# Patient Record
Sex: Male | Born: 1960 | Race: Black or African American | Hispanic: No | Marital: Single | Smoking: Never smoker
Health system: Southern US, Community
[De-identification: ages and names within clinical notes are randomized; demographics above are authoritative.]

## PROBLEM LIST (undated history)

## (undated) DIAGNOSIS — E119 Type 2 diabetes mellitus without complications: Secondary | ICD-10-CM

## (undated) DIAGNOSIS — I10 Essential (primary) hypertension: Secondary | ICD-10-CM

## (undated) DIAGNOSIS — I639 Cerebral infarction, unspecified: Secondary | ICD-10-CM

## (undated) DIAGNOSIS — I219 Acute myocardial infarction, unspecified: Secondary | ICD-10-CM

## (undated) DIAGNOSIS — I2510 Atherosclerotic heart disease of native coronary artery without angina pectoris: Secondary | ICD-10-CM

## (undated) DIAGNOSIS — R569 Unspecified convulsions: Secondary | ICD-10-CM

## (undated) HISTORY — PX: CARDIAC CATHETERIZATION: SHX172

## (undated) HISTORY — PX: CORONARY ARTERY BYPASS GRAFT: SHX141

## (undated) HISTORY — PX: CHOLECYSTECTOMY: SHX55

## (undated) HISTORY — PX: MOUTH SURGERY: SHX715

---

## 1999-12-10 ENCOUNTER — Encounter: Admission: RE | Admit: 1999-12-10 | Discharge: 2000-03-09 | Payer: Self-pay | Admitting: Unknown Physician Specialty

## 2005-06-12 ENCOUNTER — Emergency Department (HOSPITAL_COMMUNITY): Admission: EM | Admit: 2005-06-12 | Discharge: 2005-06-13 | Payer: Self-pay | Admitting: Emergency Medicine

## 2006-07-10 ENCOUNTER — Inpatient Hospital Stay (HOSPITAL_COMMUNITY): Admission: EM | Admit: 2006-07-10 | Discharge: 2006-07-22 | Payer: Self-pay | Admitting: Emergency Medicine

## 2006-09-07 ENCOUNTER — Encounter (INDEPENDENT_AMBULATORY_CARE_PROVIDER_SITE_OTHER): Payer: Self-pay | Admitting: Surgery

## 2006-09-07 ENCOUNTER — Inpatient Hospital Stay (HOSPITAL_COMMUNITY): Admission: EM | Admit: 2006-09-07 | Discharge: 2006-09-19 | Payer: Self-pay | Admitting: Emergency Medicine

## 2010-08-14 NOTE — H&P (Signed)
NAME:  GARVIN, ELLENA NO.:  [id number]   MEDICAL RECORD NO.:  [id number]          PATIENT TYPE:  INP   LOCATION:  0103                         FACILITY:  [facility]   PHYSICIAN:  Sandria Bales. Ezzard Standing, M.D.  DATE OF BIRTH:  1960/12/03   DATE OF ADMISSION:  09/07/2006  DATE OF DISCHARGE:                              HISTORY & PHYSICAL   HISTORY OF PRESENT ILLNESS:  This is a 50 year old black male who is a  patient of [facility], though he had not seen them for a couple of  years, it does not sound like, who comes with a  1 week history of  epigastric abdominal pain. He also says that he has diarrhea, though his  diarrhea pre-dates this acute illness. He has no history of peptic ulcer  disease. No liver disease, liver disease, pancreatic disease, or colon  disease. Again, he thinks for 2 or 3 years has had an average of 6 bowel  movements a day. They are usually loose but he has not seen a GI doctor  for any evaluation of this. He has had no prior abdominal surgery. He  has not been followed by a physician on a regular basis.   ALLERGIES:  NO KNOWN DRUG ALLERGIES.   CURRENT MEDICATIONS:  This is part taken from him and part taken from  the old chart.  1. Clonidine 0.1 mg t.i.d.  2. Dilantin 100 mg t.i.d.  3. Norvasc 10 mg daily.  4. Lisinopril 40 mg daily.  5. Lantus insulin 18 units nightly.  6. NovoLog.   REVIEW OF SYSTEMS:  NEUROLOGIC:  He was discharged from the service in  1980 because of seizures. I am not sure of the etiology of this. He is  trying to get into the [facility] for his medical evaluation and is  awaiting this. He has seen Dr. Melbourne Abts in the past for a seizures but  he thinks it has been a couple of years. He also attempted suicide in  April of 2008. He apparently took an overdose of Metformin. He thinks he  is straight now from all of that.  PULMONARY:  No evidence of pneumonia or tuberculosis.  CARDIAC:  He had hypertension but no cardiac  catheterization, chest  pain, or cardiac evaluation.  GASTROINTESTINAL:  See history of present illness.  UROLOGIC:  No stones or kidney infections.  ENDOCRINE:  Again, he has been diabetic. He says that this is diet  controlled until just 2 or 3 months ago, where now she is on the  insulin.   SOCIAL HISTORY:  His girlfriend, Angelique Blonder, is in the room with him. He is  unemployed right now. 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   PHYSICAL EXAMINATION:  VITAL SIGNS:  Temperature 99. Pulse 84,  respiratory rate 16, blood pressure 189/109.  GENERAL:  A well nourished black male. Alert and cooperative.  HEENT:  Unremarkable.  NECK:  Supple. No masses or thyromegaly.  LUNGS:  Clear to auscultation with symmetric breath sounds.  HEART:  Regular rate  and rhythm. I hear no murmur or rub.  ABDOMEN:  He has a fullness and tenderness in his right upper quadrant,  consistent with acute cholecystitis. I feel no other mass, no  organomegaly. He has normal bowel sounds.  EXTREMITIES:  He has good strength in all 4 extremities.  NEUROLOGIC:  Grossly intact.   LABORATORY DATA:  White blood cell count 23,800. Hemoglobin and  hematocrit 12 and 35. Platelet count 374,000. His urine drug screen was  negative. His PT was 13.6 with an INR of 1.0. Sodium 132, potassium 3.5,  chloride 95, CO2 24, glucose 156, BUN 8. His lipase was 18. His  urinalysis was negative. Ammonia level 40.   I reviewed the CT scan, which did show acute inflammatory changes around  his gallbladder, consistent with acute cholecystitis.   DIAGNOSES:  1. Acute cholecystitis. I discussed with him and his girlfriend about      proceeding with cholecystectomy at this time. I told him that there      is probably about a 70% to 80% chance that we can do this      laparoscopically but there may be as much as a 30% chance that I      will have to do open surgery, again depending how acute  the      gallbladder is and whether I can manage this laparoscopically.  I talked about the potential complications of gallbladder surgery, which  includes bleeding, infection, bile duct injury, and the possibility of  open surgery.  1. Seizures, etiology unclear. The most recent seizure was about 2      months ago.  2. Chronic diarrhea. I talked about him seeing a medical doctor, to      manage or evaluate his diarrhea further after discharge.  3. Insulin-dependent diabetes mellitus.  4. History of hypertension.  5. History of depression/recent suicide attempt.      Sandria Bales. Ezzard Standing, M.D.  Electronically Signed     DHN/MEDQ  D:  09/07/2006  T:  09/07/2006  Job:  [id number]   cc:   Dr. Dan Humphreys - [facility]   Evie Lacks, MD  Fax: [phone]

## 2010-08-14 NOTE — Op Note (Signed)
NAME:  CASTON, COOPERSMITH NO.:  [id number]   MEDICAL RECORD NO.:  [id number]          PATIENT TYPE:  INP   LOCATION:  1235                         FACILITY:  [facility]   PHYSICIAN:  Sandria Bales. Ezzard Standing, M.D.  DATE OF BIRTH:  05-25-1960   DATE OF PROCEDURE:  09/07/2006  DATE OF DISCHARGE:                               OPERATIVE REPORT   PREOPERATIVE DIAGNOSIS:  Acute cholecystitis.   POSTOPERATIVE DIAGNOSIS:  Acute chronic cholecystitis with  cholelithiasis.   OPERATION PERFORMED:  Laparoscopic cholecystectomy with intraoperative  cholangiogram (one hour extra dissection time).   SURGEON:  Sandria Bales. Ezzard Standing, M.D.   ASSISTANT:  Thornton Park. Daphine Deutscher, MD   ANESTHESIA:  General endotracheal.   ESTIMATED BLOOD LOSS:  Probably 200 mL.   INDICATIONS FOR PROCEDURE:  Mr. Lowrey is a 50 year old black gentleman  who was recently hospitalized here with a diagnosis of adult onset  diabetes mellitus and a suicide attempt overdosing with metformin, has  developed abdominal pain over the last week, presented to the [facility] emergency department where he was found to have a leukocytosis of  23,000, a  CT scan consistent with acute cholecystitis.   The indications and potential complications of gallbladder surgery were  explained to the patient, potential complications include but not  limited to bleeding, infection, bile duct injury, the possibility of  open surgery.   The patient now comes for attempted laparoscopic cholecystectomy.   DESCRIPTION OF PROCEDURE:  The patient placed in supine position, give a  general endotracheal anesthetic.  He was given a gram of cefoxitin at  initiation of the procedure.  His abdomen was prepped with Betadine  solution and sterilely draped.   The gallbladder was palpable preoperatively.  I placed five trocars, 12  mm Hasson Ethicon trocar under his umbilicus, an 11 mm subxiphoid and  then three 5's, one left paramedian 5, one right subcostal  5 and one 5  mm lateral subcostal.  Primary attention turned to the gallbladder.  The  right and left lobes of the liver were unremarkable.  Stomach was  unremarkable.  The bowel that I could see was unremarkable.  But the  gallbladder was encased in omentum, which was taken down.  The  gallbladder was acutely edematous, acutely inflamed with evidence  chronic inflammation.   After stripping down the omentum off the gallbladder,  I spent time  dissecting down to the cystic duct gallbladder junction.  I thought I  got close to it.  I tried to shoot intraoperative cholangiograms but I  could never expose enough of the cystic duct to get an adequate  catheter.  I tried to thread the catheter down the gallbladder but never  could seal it enough without leaking backwards.  I thought I was well  away from the common bile duct.  I thought it the better part of valor  at this point since his anatomy was so poorly described since the  inflammation was so intense and chronic and since he went in with normal  liver functions, was to go ahead and just oversew his gallbladder stump.  So I divided the gallbladder out. I sutured one suture with a Vicryl 2-0  suture.  I then placed a 2-0 Vicryl, a 0-0 Vicryl Endoloop, and a 0-0  PDS Endoloop over this gallbladder stump.   I washed out the bed of the gall bladder and the omental inflammation  thoroughly.  I saw no bile leak. I thought I had the gallbladder remnant  completely sewed off.  I then removed the gallbladder from the  gallbladder bed.  It was a fairly bloody endeavor because of such  chronic inflammation with acute inflammation on top of it.  I irrigated  him out with probably 8 liters of saline.  The estimated blood loss was  probably 150 to 200 mL which would be a lot for a laparoscopic  procedure.  I then put the gallbladder in an EndoCatch bag and delivered  it through the umbilicus.  I then irrigated the abdomen.  I checked the  liver  bed.  I Bovied and cauterized where I needed to.  I placed a piece  of Surgicel in the liver bed, a piece of Surgicel in the omentum.  Again, I used between 8 and 9 liters of saline for irrigation.  I then  brought out a drain in the right lateral trocar site, a 19 [place] Blake  drain, and sewed in place with a 2-0 nylon suture.   At this point the patient had done well, blood pressure had been stable.  I thought the cystic duct gallbladder junction was secure with the  Endoloops.  I had identified the triangle of Calot where I imagined it  to be and the gallbladder bed there was no bleeding, no bile leak, no  other problem.  After taking down the anterior wall defect and hernia,  after removing the trocars I then sutured the umbilical port with 0-0  Vicryl suture.  I injected with about 20 mL of Marcaine and then sewed 5-  0 Vicryl suture at each port site and then steri-stripped the ports.  Sponge and needle count were correct at the end of the case.   The patient tolerated the procedure well, was transported to recovery  room in good condition.      Sandria Bales. Ezzard Standing, M.D.  Electronically Signed     DHN/MEDQ  D:  09/07/2006  T:  09/08/2006  Job:  [id number]   cc:   Evie Lacks, MD  Fax: [phone]

## 2010-08-17 NOTE — Discharge Summary (Signed)
NAME:  ZYRION, COEY NO.:  [id number]   MEDICAL RECORD NO.:  [id number]          PATIENT TYPE:  INP   LOCATION:  1538                         FACILITY:  [facility]   PHYSICIAN:  Sandria Bales. Ezzard Standing, M.D.  DATE OF BIRTH:  [birth year]   DATE OF ADMISSION:  09/07/2006  DATE OF DISCHARGE:  09/19/2006                               DISCHARGE SUMMARY   DISCHARGE DIAGNOSES:  1. Severe acute and chronic cholecystitis with cholelithiasis.  2. Abscess in right upper quadrant/gallbladder bed.  3. Thrombocytosis.  4. History of seizure disorder.  5. Insulin-dependent diabetes mellitus.  6. Hypertension.  7. History of depression/suicide attempt.   OPERATION PERFORMED:  The patient had a laparoscopic cholecystectomy  with intraoperative cholangiogram on the 8th of June 2008.  He then had  a percutaneous drainage of a right upper quadrant gallbladder fossa  abscess on the 13th of June 2008.   HISTORY OF ILLNESS:  This is a 50 year old black male who is a patient  of [facility] who is not seen on a regular basis.  He has had a history  of epigastric abdominal pain for about 1 week's time.  His girlfriend,  who came to visit him, finally convinced to go to the [facility] emergency  room.   He has had history of loose stools going for several years.  He has  never seen a GI doctor for this.  He has had no prior abdominal surgery.   PAST MEDICAL HISTORY:  1. History of seizures.  He is trying to get into the [facility] for      medical evaluation.  He has seen Dr. Melbourne Abts in the past.  The      etiology of seizures is unknown.  2. He had a history of a suicide attempt in April 2008, where he      overdosed on metformin.  3. He has a history of diabetes.  4. He has a history of hypertension.   On admission, his temperature was 99, pulse of 84.  His abdomen had  fullness and tenderness in the right upper quadrant.  His white blood  count was 23,800, hemoglobin 12, platelet count 374,000.   A CT scan that  he had revealed acute inflammatory change around the gallbladder  consistent with acute cholecystitis.   On the day of admission, he was taken to the operating room where he  underwent a laparoscopic cholecystectomy for a very severely inflamed  and acute cholecystitis.   He was placed on cefoxitin as antibiotic postoperatively.  Liver  functions bumped up from what was felt to be presumably secondary to his  acute cholecystitis.  He also developed a thrombocytosis felt to be  secondary to his acute inflammatory action.  By the 5th day, his  temperature was only 98.4, but his white blood count had leveled off at  25,300.  I obtained a CT scan on him that showed a right upper quadrant  abscess at the gallbladder site and scheduled a percutaneous drainage of  this abscess.   I had also left the drain in at the time  of surgery.  This was left in  the entire time.  He slowly got better with an improving white blood  count.  He was afebrile.  His platelets which peaked at about 1.6  million started decreasing, and he was ready for discharge.   He was discharged home on Cipro as an antibiotic.  He was Vicodin for  pain and taking aspirin for his platelet count.   He was to see me back in about 5 days for a wound check, drain check.  He was to call for any interval problem.  He could shower but should  otherwise take it easy.   His final pathology showed acute cholecystitis and mucosal ulceration  and necrosis.  No evidence of malignancy.   Also, I strongly encouraged him to get established with a primary  medical doctor on a regular basis, either here or at the [place] to help him  manage his seizures, his hypertension, his diabetes, and he acknowledged  understanding of this.      Sandria Bales. Ezzard Standing, M.D.  Electronically Signed     DHN/MEDQ  D:  10/02/2006  T:  10/02/2006  Job:  [id number]   cc:   [facility]

## 2010-08-17 NOTE — H&P (Signed)
NAME:  Donald Marks, Donald Marks NO.:  [id number]   MEDICAL RECORD NO.:  [id number]          PATIENT TYPE:  INP   LOCATION:  0109                         FACILITY:  [facility]   PHYSICIAN:  Lonia Blood, M.D.      DATE OF BIRTH:  1961-03-27   DATE OF ADMISSION:  07/10/2006  DATE OF DISCHARGE:                              HISTORY & PHYSICAL   PRIMARY CARE PHYSICIAN:  The patient is unassigned to [place].   PRESENTING COMPLAINT:  Suicide attempt.   HISTORY OF PRESENT ILLNESS:  The patient is a 50 year old gentleman that  was brought in here by the police secondary to suicide attempt.  The  patient apparently was arrested last month and has so much going on in  his life.  He sent in a 10-page suicide note to the police department  today and then took about 40 pills of metformin, according to him,  around 7 a.m. this morning.  He was brought in altered mental status,  weak.  He is currently fully awake and communicating.  Per patient, his  only complaint is that he has some queasy feelings in his stomach .  Otherwise, no other complaints.  He had no prior history of psychiatric  disease.   PAST MEDICAL HISTORY:  Significant for diabetes, seizure disorder, and  depression.   ALLERGIES:  He has no known drug allergies.   MEDICATIONS:  1. Xanax 1 mg p.r.n.  2. Ambien 10 mg q.h.s. p.r.n.  3. Percocet 5/325 as needed.  4. Monopril 40 mg daily.  5. Prednisone taper.  6. Vicoprofen 200/7.5 as needed.  7. Valtrex 1 g t.i.d.  8. Metformin 500 mg tablets.   SOCIAL HISTORY:  The patient lives alone.  Denied any tobacco or alcohol  use.  No IV drug use.   FAMILY HISTORY:  Significant for diabetes, hypertension in his father.  Mother died from stroke.   REVIEW OF SYSTEMS:  Twelve-point review of systems is negative except  per HPI.   PHYSICAL EXAMINATION:  VITAL SIGNS:  Temperature 97.6, blood pressure  initially 241/139, pulse 104, respiratory rate 22, saturations 96% on  room  air.  GENERAL:  The patient is awake, alert, oriented, in no acute distress.  He is restrained.  HEENT:  PERRL.  EOMI.  NECK:  Supple.  No JVD, no lymphadenopathy.  RESPIRATORY:  He has good air entry bilaterally.  No wheezes, no rales.  CARDIOVASCULAR:  He has S1, S2, no murmurs.  ABDOMEN:  Soft, nontender, with positive bowel sounds.  EXTREMITIES:  No edema, cyanosis or clubbing.   LABORATORIES:  A pH is 7.477, pCO2 26.7, pO2 134 on 2 L.  UA:  Essentially green urine, glucose 500, trace ketones, total protein of  100.  Urine drug screen is negative.  Initial cardiac enzymes negative.  Sodium 140, potassium 3.4, chloride 103, CO2 24, glucose 272, BUN 8,  creatinine 0.71, calcium 9.6, total protein 8.0, albumin 4.4, AST 36,  ALT 50.  BNP is currently pending.  Total bilirubin direct 0.1.  Acetaminophen level less than 10.  Lactic acid 5.0.   ASSESSMENT:  This is a 50 year old gentleman presenting with metformin  overdose with suicidal attempt.  The patient was trying to commit  suicide he has owned up to that.  He is currently awake, alert,  oriented, but will admit him for medical treatment prior to transfer  into inpatient psychiatry.   PLAN:  1. Will get him with serial glucose monitor.  Lactic acidosis has      already set in and will follow closely and get him on fluids.  No      insulin.  If his sugars start dropping will start some D5/water.  2. Hypokalemia.  Again, will replete his potassium.  We will follow      the patient's pH level by checking a repeat ABG.  If his pH drops      to less than 7.1, will consider either dialysis or sodium      bicarbonate.  At this point, however, use of sodium bicarbonate is      controversial, especially in the patient who is healthy and not      acidotic, although he has lactic acidosis.  3. Lactic acidosis.  As indicated above, the use of either bicarbonate      or dialysis at this point is not indicated based on his current pH,       but we will check an ABG every 4 hours x4 to see the trend.  4. Hypertension.  I will start the patient on some clonidine patch and      also oral clonidine if necessary and see how he does.  Otherwise,      will get psychiatry to see the patient once he is more stable,  and      then follow with some psychiatric care.      Lonia Blood, M.D.  Electronically Signed     LG/MEDQ  D:  07/10/2006  T:  07/10/2006  Job:  [id number]

## 2010-08-17 NOTE — Consult Note (Signed)
NAME:  Donald, Marks NO.:  [id number]   MEDICAL RECORD NO.:  [id number]          PATIENT TYPE:  IPS   LOCATION:  NA                            FACILITY[facility]  [facility]   PHYSICIAN:  Antonietta Breach, M.D.  DATE OF BIRTH:  04-19-1960   DATE OF CONSULTATION:  07/21/2006  DATE OF DISCHARGE:                                 CONSULTATION   Donald Marks continues without any suicidal ideation.  His concentration  is now within normal limits.  His energy has been normal.  His interests  have returned.  He has positive future goals and a constructive level of  regret about the suicide attempt.   He is sleeping well, and his appetite is normal.  He has received a  great deal of support from church members and family members, as well as  his friends.  He has a solid social support system.   He also has been talking to his church pastor about being involved in  some of the outreach of his church.  This will help him to have a sense  of vocational purpose, as he pursues his necessary legal battles and  potentially re-training in a different occupation.   MENTAL STATUS EXAM:  Donald Marks is alert.  He is oriented to all  spheres.  His thought process is logical, coherent, goal-directed.  No  looseness of associations.  THOUGHT CONTENT:  No thoughts of harming  himself.  No thoughts of harming others.  No delusions, no  hallucinations.  MEMORY:  within normal limits.  CONCENTRATION:  Within  normal limits.  AFFECT:  Broad and appropriate.  MOOD:  Within normal  limits.  INSIGHT:  Good.  JUDGMENT:  Intact.   ASSESSMENT:  Adjustment disorder with depressed mood, stable.   The patient desires outpatient psychiatric care and is no longer  committable.  The undersigned has provided ego-supportive psychotherapy and education  during his hospital course.  The patient acknowledges that outpatient  counseling for supporting coping skills and stress management will be  useful.   RECOMMENDATIONS:  The undersigned has asked the social worker to pursue  psychiatric followup at one of the following:  [facility].  One of the clinics attached to [facility], [place]  or [employer], or the county mental health center.  Other options  for the patient include family and child services.   Donald Marks agrees to call emergency services immediately for any  thoughts of harming himself, thoughts of harming other or distress.      Antonietta Breach, M.D.  Electronically Signed     JW/MEDQ  D:  07/21/2006  T:  07/21/2006  Job:  [id number]

## 2010-08-17 NOTE — Discharge Summary (Signed)
NAMEDANTA, BAUMGARDNER NO.:  [id number]   MEDICAL RECORD NO.:  [id number]          PATIENT TYPE:  INP   LOCATION:  1338                         [facility]AC[facility]   PHYSICIAN:  Ladell Pier, M.D.   DATE OF BIRTH:  [birth year]   DATE OF ADMISSION:  07/10/2006  DATE OF DISCHARGE:  07/22/2006                               DISCHARGE SUMMARY   ADDENDUM   DISCHARGE DIAGNOSIS:  As listed previously.   DISCHARGE MEDICATIONS:  1. Clonidine 0.1 t.i.d.  2. Dilantin 100 mg t.i.d.  3. Norvasc 10 mg daily.  4. Lisinopril 40 mg daily.  5. Lantus 18 units subcutaneously nightly.  6. Sliding scale insulin.  7. Hydrochlorothiazide 25 mg daily.   HOSPITAL COURSE:  1. Patient's discharge was held secondary to no bed available at the      [facility].  After waiting for a week for a bed, patient was re-      evaluated by Dr. Jeanie Sewer and found to be ready for discharge.      Patient will be scheduled with appointment for followup at the [facility] by the social worker.  2. Hypertension:  Blood pressure was elevated during this stay, so his      Norvasc and hydrochlorothiazide was added to his medications.  His      blood pressure on discharge was still slightly elevated.  He will      follow up with his primary care doctor for adjustment of his      medication.  On discharge, his blood pressure was 149/99.  3. Diabetes:  Blood sugars were elevated and his Lantus was increased      to 18 units.  Blood sugars normalized.  Blood sugar on discharge      was 82.   LABORATORY ON DISCHARGE:  Sodium 137, potassium 3.5, chloride 105, CO2  26, BUN 10, creatinine 0.62, glucose 130.      Ladell Pier, M.D.  Electronically Signed     NJ/MEDQ  D:  07/22/2006  T:  07/22/2006  Job:  [id number]

## 2010-08-17 NOTE — Consult Note (Signed)
NAMESOLAN, VOSLER NO.:  [id number]   MEDICAL RECORD NO.:  [id number]          PATIENT TYPE:  INP   LOCATION:  1226                         FACILITY:  [facility]   PHYSICIAN:  Antonietta Breach, M.D.  DATE OF BIRTH:  1960-04-06   DATE OF CONSULTATION:  07/11/2006  DATE OF DISCHARGE:                                 CONSULTATION   REQUESTING PHYSICIAN:  Dr. Mikeal Hawthorne   REASON FOR CONSULTATION:  Depression with suicide attempt.   HISTORY OF PRESENT ILLNESS:  [name] is a 50 year old male  admitted to the [facility] after ingesting 40 tablets of  Metformin.   [name] is very explicit in stating that this was a suicide attempt.  He has been experiencing depressed mood for several days along with  decreased concentration, insomnia, anhedonia, hopelessness, helplessness  as well as suicidal thoughts.   The patient has been charged with impersonating a Emergency planning/management officer.  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  He thoroughly believes that he is  innocent; however, given the charge of impersonating a Emergency planning/management officer,  he has not been able to work.  His income has dropped to none.  He faces  an indefinite amount of unemployment along with a protracted legal  process.   He has no thoughts of harming others, no delusions, no hallucinations.  He persists in his intent to kill himself; however, he is willing and  wants to accept psychiatric care including inpatient care.   PAST PSYCHIATRIC HISTORY:  The patient has no history of major  depression.  He has no history of mania, hallucinations, or delusions.  He has no history of psychotropic medication or psychiatric care.   FAMILY PSYCHIATRIC HISTORY:  His father reportedly suffers from  posttraumatic stress disorder secondary to [place] War experience.   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  He required medical  retirement due to seizures.  He did attend the police academy but did  not pass the physical section due to his seizure disorder.  He has no  children.  He is single.  He does not use alcohol or illegal drugs.  He  lives alone.  He has many supportive friends.   GENERAL MEDICAL PROBLEMS:  1. Seizure disorder.  2. Status post ingestion of 40 Metformin tablets.  3. Rule out hypertension.   MEDICATIONS:  The MAR is reviewed.  The patient is back on his Dilantin  100 mg t.i.d.  He is on Ativan 1-2 mg q.8h. p.r.n.  He has no known drug  allergies.   LABORATORY DATA:  CBC is unremarkable except for a slightly increased  white count of 11.4 which may be secondary to stress demargination.  He  has an INR of 0.9 which is within normal limits.  His metabolic panel is  significant for hyperglycemia at 272 upon initial and 174 this morning  at 6 a.m.  His SGOT is normal at 36.  SGPT is normal at 50.  Calcium is  9.3.  His lactic acid level this morning had decreased from 5 down to  1.5 which is within the normal range.  His TSH is 1.4, and that is  within normal limits.  Tylenol is negative.  Dilantin was negative when  he came in.  Aspirin negative.  Urine drug screen negative.  Alcohol  negative.  He is pending a new Dilantin level after reloading.   Head CT without contrast on the 10th of April showed no findings.   REVIEW OF SYSTEMS:  Noncontributory.   PHYSICAL EXAMINATION:  VITAL SIGNS:  Temperature 98.4, pulse 76,  respiration 20, blood pressure 100/52.   MENTAL STATUS EXAM:  [name] is a middle-aged male, lying in a supine  position in his hospital bed, alert and oriented to all spheres.  His  affect is very constricted.  His mood is depressed.  His fund of  knowledge and intelligence are within normal limits.  His speech  involves normal rate and prosody; however, he is soft spoken.  His  facies involve a very forlorn  expression with profound thoughts of  hopeless and helplessness as well as suicidal ideation.  Has no thoughts  of harming others.  He has no delusions or hallucinations.  Thought  process is logical, coherent, and goal direct.  No looseness of  associations.  Insight is intact for the severity of his depression.  His judgment is intact for the need of psychiatric treatment.  His  concentration is decreased.  He is well groomed and cooperative with the  interview as well as bedside care.   ASSESSMENT:  AXIS I:  1. Major depressive disorder, single episode, severe.  AXIS II:  Deferred.  AXIS III:  See general medical problems.  AXIS IV:  Legal occupational.  AXIS V:  20.   [name] is still at risk to harm himself.   RECOMMENDATIONS:  1. I would continue the sitter.  2. Once medically clear, would admit [name] to a psychiatric unit      for further evaluation and treatment of depression.  3. Psychotropic medication treatment deferred.      Antonietta Breach, M.D.  Electronically Signed     JW/MEDQ  D:  07/11/2006  T:  07/11/2006  Job:  [id number]

## 2010-08-17 NOTE — Discharge Summary (Signed)
Donald Marks, Donald Marks NO.:  [id number]   MEDICAL RECORD NO.:  [id number]          PATIENT TYPE:  INP   LOCATION:  1226                         FACILITY:  [facility]   PHYSICIAN:  Isidor Holts, M.D.  DATE OF BIRTH:  [birth year]   DATE OF ADMISSION:  07/10/2006  DATE OF DISCHARGE:                               DISCHARGE SUMMARY   PRIMARY MEDICAL DOCTOR:  Unassigned.   DISCHARGE DIAGNOSES:  1. Metformin overdose.  2. Lactic acidosis, secondary to #1 above.  3. Depression.  4. Suicide attempt.  5. Type 2 diabetes mellitus.  6. Seizure disorder.  7. Hypertension.   DISCHARGE MEDICATIONS:  1. Protonix 40 mg p.o. daily.  2. Clonidine 0.1 mg p.o. t.i.d.  3. Norvasc 5 mg p.o. daily.  4. Dilantin 100 mg p.o. t.i.d.  5. Lisinopril 40 mg p.o. daily.  6. Sliding scale insulin coverage, insulin-sensitive scale.   CONSULTATIONS:  Dr. Antonietta Breach, psychiatrist.   PROCEDURES:  1. Chest CT scan dated July 10, 2006.  This showed no acute      cardiopulmonary findings.  2. Head CT scan dated July 10, 2006.  This was a negative noncontrast      head CT.   ADMISSION HISTORY:  As in H&P notes of July 10, 2006,dictated by Dr.  Lonia Blood. However, in brief this is a 50 year old male, with known  history of type 2 diabetes mellitus, seizure disorder, hypertension, who  was brought to the emergency department at [facility] by police, following a suicide attempt in which the patient  ingested about 40 tablets of Metformin.  He had apparently sent a 10-  page suicide note to the police department.  On arrival, he was found to  be somewhat confused but otherwise clinically stable.  Blood pressure  was remarkably elevated at 241/139 mmHg.  Lactic acid level was 5.0.  ABG showed the following findings:  The pH was 7.477, pCO2 26.7, pO2 134  on 2 L of oxygen, CO2 was 24.  The patient was admitted for further  evaluation, investigation, and  management.   CLINICAL COURSE:  #1 - METFORMIN OVERDOSE.  For details of presentation,  refer to admission history above.  Reportedly, the patient ingested  about 40 pills of Metformin.  However, at the time of initial evaluation  he was found to have a blood glucose level of 272.  Lactic acid levels  were, however, elevated and he was admitted to the step-down unit,  monitored telemetrically, commenced on IV fluids, serial CBGs were  monitored.  Fortunately, no hypoglycemic episodes were documented  throughout the course of the patient's hospitalization and by July 11, 2006, lactic acid level had dropped to 1.5, i.e. within normal limits.  The patient had mild hypokalemia which was appropriately supplemented.   #2 - LACTIC ACIDOSIS SECONDARY TO #1 ABOVE.  As mentioned above, this  resolved by July 11, 2006.   #3 - DEPRESSION/SUICIDE ATTEMPT.  The patient was seen by Dr. Antonietta Breach, psychiatrist.  For details of consultation, refer to  consultation note of July 11, 2006.  Conclusion  is that the patient is  still at considerable risk of self-harm and will benefit from inpatient  management at the [facility].  Arrangements are therefore  in place to transfer the patient to [facility], once  medically cleared.   #4 - HYPERTENSION.  At the time of presentation the patient was found to  have markedly elevated blood pressure.  This was addressed with a  combination of Clonidine, ACE inhibitor and calcium channel blocker, and  we are glad to note that as of a.m. of July 12, 2006, blood pressure  had normalized at 120/78.   #5 - TYPE 2 DIABETES MELLITUS.  As mentioned in #1 above, the patient's  CBGs remained within reasonable limits during the course of his  hospitalization.  As of July 12, 2006, these ranged between 158 and  192.  We are understandably reluctant to commence the patient on oral  hypoglycemic medications at this time.  However, for  glycemic control  the patient has been placed on sliding scale insulin coverage in  sensitive scale for now.  He may be transitioned to oral hypoglycemic  medication after his psychiatric status has stabilized and he is cleared  by [facility] for discharge.   #6 - SEIZURE DISORDER.  No seizures were recorded during the course of  this hospitalization.  The patient continues on Dilantin.   DISPOSITION:  The patient was considered sufficiently clinically  recovered and stable medically to be discharged to [facility] for continued psychiatric management on July 12, 2006.  He has  therefore been discharged accordingly and will be transferred to  [facility] as soon as appropriate arrangements are in  place.  Metformin may be recommenced in an initial dose of 500mg  p.o. daily, in  a day or two.      Isidor Holts, M.D.  Electronically Signed     CO/MEDQ  D:  07/12/2006  T:  07/12/2006  Job:  [id number]   cc:   Antonietta Breach, M.D.

## 2010-08-17 NOTE — Discharge Summary (Signed)
[name], [name] NO.:  [id number]   MEDICAL RECORD NO.:  [id number]          PATIENT TYPE:  INP   LOCATION:  1338                         FACILITY:  [facility]   PHYSICIAN:  Hettie Holstein, D.O.    DATE OF BIRTH:  08/22/1960   DATE OF ADMISSION:  07/10/2006  DATE OF DISCHARGE:                               DISCHARGE SUMMARY   ADDENDUM:  Please append this to job [id number].   Adjustments to medications prior to transfer to [facility] psychiatric  inpatient bed.  Please refer to medications as dictated by Dr.  Isidor Holts.  This list is going to be amended in that he will be  continued on:   1. Clonidine 0.1 mg p.o. t.i.d. as described.  2. Dilantin 100 mg p.o. t.i.d. as described.  3. Norvasc 10 mg p.o. q.24h.  This is an increase.  4. Lisinopril 40 mg p.o. q.24h.  5. Lantus 10 units subcu q.h.s. with instructions to follow his CBGs      as per insulin sliding scale at the facility.  He can be placed on      a moderate insulin sensitivity scale.  His Lantus can begin at 10      units subcu q.h.s., be adjusted based on his a.m. CBG fasting.      Hettie Holstein, D.O.  Electronically Signed     ESS/MEDQ  D:  07/17/2006  T:  07/17/2006  Job:  [id number] [id number]   cc:   [facility]

## 2011-01-16 LAB — DIFFERENTIAL
Basophils Absolute: 0.2 — ABNORMAL HIGH
Basophils Relative: 0
Eosinophils Absolute: 0.2
Eosinophils Relative: 1
Lymphocytes Relative: 14
Lymphocytes Relative: 15
Lymphs Abs: 2.4
Monocytes Relative: 7
Monocytes Relative: 8
Neutro Abs: 12.7 — ABNORMAL HIGH
Neutrophils Relative %: 77

## 2011-01-16 LAB — BASIC METABOLIC PANEL WITH GFR
BUN: 8
CO2: 24
Calcium: 8.7
Chloride: 98
Creatinine, Ser: 0.72
GFR calc non Af Amer: >60
Glucose, Bld: 115 — ABNORMAL HIGH
Potassium: 4.3
Sodium: 132 — ABNORMAL LOW

## 2011-01-16 LAB — CBC
HCT: 25.2 — ABNORMAL LOW
HCT: 25.4 — ABNORMAL LOW
HCT: 25.4 — ABNORMAL LOW
HCT: 25.7 — ABNORMAL LOW
HCT: 26.1 — ABNORMAL LOW
HCT: 26.3 — ABNORMAL LOW
Hemoglobin: 8.7 — ABNORMAL LOW
Hemoglobin: 8.7 — ABNORMAL LOW
Hemoglobin: 8.9 — ABNORMAL LOW
Hemoglobin: 8.9 — ABNORMAL LOW
MCHC: 33.9
MCHC: 34.1
MCHC: 34.4
MCHC: 34.4
MCHC: 34.6
MCV: 84.2
MCV: 84.3
MCV: 84.3
MCV: 84.5
Platelets: 1201
Platelets: 1494
Platelets: 1589
Platelets: 1616
Platelets: 1739
RBC: 3.01 — ABNORMAL LOW
RBC: 3.01 — ABNORMAL LOW
RBC: 3.05 — ABNORMAL LOW
RBC: 3.12 — ABNORMAL LOW
RDW: 13.2
RDW: 13.3
RDW: 13.4
RDW: 13.5
RDW: 13.6
WBC: 16.8 — ABNORMAL HIGH
WBC: 19.5 — ABNORMAL HIGH
WBC: 19.7 — ABNORMAL HIGH
WBC: 21.1 — ABNORMAL HIGH
WBC: 22.6 — ABNORMAL HIGH

## 2011-01-16 LAB — COMPREHENSIVE METABOLIC PANEL
Albumin: 2 — ABNORMAL LOW
Albumin: 2.3 — ABNORMAL LOW
Alkaline Phosphatase: 450 — ABNORMAL HIGH
Alkaline Phosphatase: 578 — ABNORMAL HIGH
Alkaline Phosphatase: 605 — ABNORMAL HIGH
BUN: 10
BUN: 12
BUN: 13
CO2: 26
Calcium: 8.9
Chloride: 99
Creatinine, Ser: 0.82
Creatinine, Ser: 0.92
GFR calc Af Amer: >60
GFR calc non Af Amer: >60
Glucose, Bld: 127 — ABNORMAL HIGH
Potassium: 4.3
Potassium: 5.2 — ABNORMAL HIGH
Sodium: 131 — ABNORMAL LOW
Total Bilirubin: 0.8
Total Protein: 7

## 2011-01-16 LAB — HEPATIC FUNCTION PANEL
AST: 44 — ABNORMAL HIGH
Total Bilirubin: 0.8
Total Protein: 7.1

## 2011-01-16 LAB — BASIC METABOLIC PANEL
BUN: 8
CO2: 26
GFR calc Af Amer: >60

## 2011-01-16 LAB — LIPASE, BLOOD: Lipase: 25

## 2011-01-17 LAB — RAPID URINE DRUG SCREEN, HOSP PERFORMED
Cocaine: NOT DETECTED
Tetrahydrocannabinol: NOT DETECTED

## 2011-01-17 LAB — DIFFERENTIAL
Basophils Absolute: 0
Basophils Absolute: 0
Basophils Absolute: 0
Basophils Absolute: 0
Basophils Absolute: 0
Basophils Relative: 0
Basophils Relative: 0
Basophils Relative: 0
Eosinophils Absolute: 0
Eosinophils Absolute: 0.1
Eosinophils Absolute: 0.1
Eosinophils Absolute: 0.3
Eosinophils Relative: 0
Eosinophils Relative: 0
Eosinophils Relative: 0
Eosinophils Relative: 0
Eosinophils Relative: 1
Lymphocytes Relative: 5 — ABNORMAL LOW
Lymphocytes Relative: 5 — ABNORMAL LOW
Lymphocytes Relative: 7 — ABNORMAL LOW
Lymphocytes Relative: 7 — ABNORMAL LOW
Lymphs Abs: 1.6
Lymphs Abs: 1.8
Lymphs Abs: 2.3
Monocytes Absolute: 2 — ABNORMAL HIGH
Monocytes Absolute: 2.3 — ABNORMAL HIGH
Monocytes Absolute: 2.5 — ABNORMAL HIGH
Monocytes Relative: 1 — ABNORMAL LOW
Monocytes Relative: 8
Monocytes Relative: 9
Neutro Abs: 15.3 — ABNORMAL HIGH
Neutro Abs: 18.2 — ABNORMAL HIGH
Neutro Abs: 21.6 — ABNORMAL HIGH
Neutro Abs: 34.2 — ABNORMAL HIGH
Neutrophils Relative %: 79 — ABNORMAL HIGH
Neutrophils Relative %: 80 — ABNORMAL HIGH
Neutrophils Relative %: 84 — ABNORMAL HIGH
Neutrophils Relative %: 84 — ABNORMAL HIGH
Neutrophils Relative %: 86 — ABNORMAL HIGH

## 2011-01-17 LAB — CBC
HCT: 25.7 — ABNORMAL LOW
HCT: 27.4 — ABNORMAL LOW
HCT: 31.9 — ABNORMAL LOW
Hemoglobin: 12.1 — ABNORMAL LOW
Hemoglobin: 9.4 — ABNORMAL LOW
Hemoglobin: 9.5 — ABNORMAL LOW
Hemoglobin: 9.6 — ABNORMAL LOW
MCHC: 34.1
MCHC: 34.6
MCHC: 34.7
MCHC: 34.9
MCV: 83.7
MCV: 84.3
MCV: 85.4
Platelets: 1136
Platelets: 334
Platelets: 374
Platelets: 946
RBC: 3.02 — ABNORMAL LOW
RBC: 3.21 — ABNORMAL LOW
RBC: 3.32 — ABNORMAL LOW
RBC: 3.73 — ABNORMAL LOW
RBC: 4.17 — ABNORMAL LOW
RDW: 13
RDW: 13.4
RDW: 13.4
RDW: 13.5
RDW: 13.6
WBC: 19.4 — ABNORMAL HIGH
WBC: 21.2 — ABNORMAL HIGH
WBC: 23.8 — ABNORMAL HIGH
WBC: 25.2 — ABNORMAL HIGH
WBC: 36.4 — ABNORMAL HIGH

## 2011-01-17 LAB — CULTURE, BLOOD (ROUTINE X 2)

## 2011-01-17 LAB — URINALYSIS, ROUTINE W REFLEX MICROSCOPIC
Glucose, UA: NEGATIVE
Ketones, ur: >80 — AB
Nitrite: NEGATIVE
Specific Gravity, Urine: 1.021
pH: 6.5

## 2011-01-17 LAB — COMPREHENSIVE METABOLIC PANEL
ALT: 37
ALT: 53
ALT: 60 — ABNORMAL HIGH
AST: 16
AST: 55 — ABNORMAL HIGH
Albumin: 2.8 — ABNORMAL LOW
Alkaline Phosphatase: 211 — ABNORMAL HIGH
Alkaline Phosphatase: 225 — ABNORMAL HIGH
BUN: 4 — ABNORMAL LOW
BUN: 7
CO2: 24
CO2: 25
CO2: 26
CO2: 27
Calcium: 8.5
Calcium: 8.9
Chloride: 98
Chloride: 99
Creatinine, Ser: 0.8
Creatinine, Ser: 0.81
GFR calc Af Amer: >60
GFR calc Af Amer: >60
GFR calc non Af Amer: >60
GFR calc non Af Amer: >60
GFR calc non Af Amer: >60
Glucose, Bld: 153 — ABNORMAL HIGH
Glucose, Bld: 184 — ABNORMAL HIGH
Glucose, Bld: 207 — ABNORMAL HIGH
Potassium: 3.5
Potassium: 3.8
Sodium: 132 — ABNORMAL LOW
Sodium: 134 — ABNORMAL LOW
Total Bilirubin: 1.6 — ABNORMAL HIGH
Total Bilirubin: 2.8 — ABNORMAL HIGH
Total Protein: 6.6

## 2011-01-17 LAB — BASIC METABOLIC PANEL
BUN: 6
CO2: 26
Calcium: 8.4
Creatinine, Ser: 0.81
GFR calc non Af Amer: >60
Glucose, Bld: 133 — ABNORMAL HIGH
Sodium: 130 — ABNORMAL LOW

## 2011-01-17 LAB — WOUND CULTURE

## 2011-01-17 LAB — URINE MICROSCOPIC-ADD ON

## 2011-01-17 LAB — PHENYTOIN LEVEL, TOTAL: Phenytoin Lvl: 8.8 — ABNORMAL LOW

## 2011-01-17 LAB — ANAEROBIC CULTURE

## 2011-01-17 LAB — AMMONIA: Ammonia: 40 — ABNORMAL HIGH

## 2011-01-17 LAB — PROTIME-INR: INR: 1

## 2015-05-03 DIAGNOSIS — I639 Cerebral infarction, unspecified: Secondary | ICD-10-CM

## 2015-05-03 HISTORY — DX: Cerebral infarction, unspecified: I63.9

## 2015-05-11 ENCOUNTER — Emergency Department (HOSPITAL_COMMUNITY): Payer: Non-veteran care

## 2015-05-11 ENCOUNTER — Inpatient Hospital Stay (HOSPITAL_COMMUNITY)
Admission: EM | Admit: 2015-05-11 | Discharge: 2015-05-16 | DRG: 065 | Disposition: A | Discharge status: Skilled Nursing Facility | Payer: Non-veteran care | Attending: Family Medicine | Admitting: Family Medicine

## 2015-05-11 ENCOUNTER — Encounter (HOSPITAL_COMMUNITY): Payer: Self-pay

## 2015-05-11 DIAGNOSIS — R569 Unspecified convulsions: Secondary | ICD-10-CM | POA: Diagnosis not present

## 2015-05-11 DIAGNOSIS — Z91041 Radiographic dye allergy status: Secondary | ICD-10-CM

## 2015-05-11 DIAGNOSIS — Z794 Long term (current) use of insulin: Secondary | ICD-10-CM

## 2015-05-11 DIAGNOSIS — Z951 Presence of aortocoronary bypass graft: Secondary | ICD-10-CM

## 2015-05-11 DIAGNOSIS — Z833 Family history of diabetes mellitus: Secondary | ICD-10-CM

## 2015-05-11 DIAGNOSIS — Z6834 Body mass index (BMI) 34.0-34.9, adult: Secondary | ICD-10-CM

## 2015-05-11 DIAGNOSIS — R29702 NIHSS score 2: Secondary | ICD-10-CM | POA: Diagnosis present

## 2015-05-11 DIAGNOSIS — Z955 Presence of coronary angioplasty implant and graft: Secondary | ICD-10-CM

## 2015-05-11 DIAGNOSIS — I639 Cerebral infarction, unspecified: Secondary | ICD-10-CM | POA: Diagnosis present

## 2015-05-11 DIAGNOSIS — M25461 Effusion, right knee: Secondary | ICD-10-CM | POA: Diagnosis not present

## 2015-05-11 DIAGNOSIS — I2510 Atherosclerotic heart disease of native coronary artery without angina pectoris: Secondary | ICD-10-CM | POA: Diagnosis present

## 2015-05-11 DIAGNOSIS — E1165 Type 2 diabetes mellitus with hyperglycemia: Secondary | ICD-10-CM | POA: Diagnosis present

## 2015-05-11 DIAGNOSIS — R739 Hyperglycemia, unspecified: Secondary | ICD-10-CM | POA: Insufficient documentation

## 2015-05-11 DIAGNOSIS — R402252 Coma scale, best verbal response, oriented, at arrival to emergency department: Secondary | ICD-10-CM | POA: Diagnosis present

## 2015-05-11 DIAGNOSIS — R52 Pain, unspecified: Secondary | ICD-10-CM

## 2015-05-11 DIAGNOSIS — N179 Acute kidney failure, unspecified: Secondary | ICD-10-CM | POA: Insufficient documentation

## 2015-05-11 DIAGNOSIS — E1151 Type 2 diabetes mellitus with diabetic peripheral angiopathy without gangrene: Secondary | ICD-10-CM | POA: Diagnosis present

## 2015-05-11 DIAGNOSIS — G8194 Hemiplegia, unspecified affecting left nondominant side: Secondary | ICD-10-CM | POA: Diagnosis present

## 2015-05-11 DIAGNOSIS — R7989 Other specified abnormal findings of blood chemistry: Secondary | ICD-10-CM | POA: Insufficient documentation

## 2015-05-11 DIAGNOSIS — I252 Old myocardial infarction: Secondary | ICD-10-CM

## 2015-05-11 DIAGNOSIS — Z9049 Acquired absence of other specified parts of digestive tract: Secondary | ICD-10-CM

## 2015-05-11 DIAGNOSIS — Z7982 Long term (current) use of aspirin: Secondary | ICD-10-CM

## 2015-05-11 DIAGNOSIS — Y93E1 Activity, personal bathing and showering: Secondary | ICD-10-CM

## 2015-05-11 DIAGNOSIS — I638 Other cerebral infarction: Secondary | ICD-10-CM | POA: Diagnosis not present

## 2015-05-11 DIAGNOSIS — Z79899 Other long term (current) drug therapy: Secondary | ICD-10-CM

## 2015-05-11 DIAGNOSIS — E118 Type 2 diabetes mellitus with unspecified complications: Secondary | ICD-10-CM

## 2015-05-11 DIAGNOSIS — Z8249 Family history of ischemic heart disease and other diseases of the circulatory system: Secondary | ICD-10-CM

## 2015-05-11 DIAGNOSIS — W182XXA Fall in (into) shower or empty bathtub, initial encounter: Secondary | ICD-10-CM | POA: Diagnosis not present

## 2015-05-11 DIAGNOSIS — E785 Hyperlipidemia, unspecified: Secondary | ICD-10-CM | POA: Diagnosis present

## 2015-05-11 DIAGNOSIS — R402362 Coma scale, best motor response, obeys commands, at arrival to emergency department: Secondary | ICD-10-CM | POA: Diagnosis present

## 2015-05-11 DIAGNOSIS — G40909 Epilepsy, unspecified, not intractable, without status epilepticus: Secondary | ICD-10-CM | POA: Diagnosis present

## 2015-05-11 DIAGNOSIS — R531 Weakness: Secondary | ICD-10-CM | POA: Diagnosis not present

## 2015-05-11 DIAGNOSIS — E669 Obesity, unspecified: Secondary | ICD-10-CM | POA: Diagnosis present

## 2015-05-11 DIAGNOSIS — I10 Essential (primary) hypertension: Secondary | ICD-10-CM | POA: Diagnosis present

## 2015-05-11 DIAGNOSIS — Z23 Encounter for immunization: Secondary | ICD-10-CM

## 2015-05-11 DIAGNOSIS — R296 Repeated falls: Secondary | ICD-10-CM | POA: Diagnosis present

## 2015-05-11 DIAGNOSIS — R402142 Coma scale, eyes open, spontaneous, at arrival to emergency department: Secondary | ICD-10-CM | POA: Diagnosis present

## 2015-05-11 DIAGNOSIS — IMO0002 Reserved for concepts with insufficient information to code with codable children: Secondary | ICD-10-CM | POA: Insufficient documentation

## 2015-05-11 DIAGNOSIS — I161 Hypertensive emergency: Secondary | ICD-10-CM | POA: Diagnosis present

## 2015-05-11 HISTORY — DX: Atherosclerotic heart disease of native coronary artery without angina pectoris: I25.10

## 2015-05-11 HISTORY — DX: Unspecified convulsions: R56.9

## 2015-05-11 HISTORY — DX: Essential (primary) hypertension: I10

## 2015-05-11 HISTORY — DX: Type 2 diabetes mellitus without complications: E11.9

## 2015-05-11 HISTORY — DX: Acute myocardial infarction, unspecified: I21.9

## 2015-05-11 HISTORY — DX: Cerebral infarction, unspecified: I63.9

## 2015-05-11 LAB — CBC
HCT: 36.6 % — ABNORMAL LOW (ref 39.0–52.0)
Hemoglobin: 12.1 g/dL — ABNORMAL LOW (ref 13.0–17.0)
MCH: 29.1 pg (ref 26.0–34.0)
MCHC: 33.1 g/dL (ref 30.0–36.0)
MCV: 88 fL (ref 78.0–100.0)
Platelets: 325 10*3/uL (ref 150–400)
RBC: 4.16 MIL/uL — AB (ref 4.22–5.81)
RDW: 13.5 % (ref 11.5–15.5)
WBC: 7.5 10*3/uL (ref 4.0–10.5)

## 2015-05-11 LAB — URINE MICROSCOPIC-ADD ON

## 2015-05-11 LAB — URINALYSIS, ROUTINE W REFLEX MICROSCOPIC
Bilirubin Urine: NEGATIVE
Glucose, UA: >1000 mg/dL — AB
Hgb urine dipstick: NEGATIVE
KETONES UR: NEGATIVE mg/dL
LEUKOCYTES UA: NEGATIVE
NITRITE: NEGATIVE
Protein, ur: 100 mg/dL — AB
Specific Gravity, Urine: 1.017 (ref 1.005–1.030)
pH: 6.5 (ref 5.0–8.0)

## 2015-05-11 LAB — COMPREHENSIVE METABOLIC PANEL
ALT: 13 U/L — AB (ref 17–63)
AST: 21 U/L (ref 15–41)
Albumin: 2.9 g/dL — ABNORMAL LOW (ref 3.5–5.0)
Alkaline Phosphatase: 90 U/L (ref 38–126)
Anion gap: 10 (ref 5–15)
BILIRUBIN TOTAL: 0.2 mg/dL — AB (ref 0.3–1.2)
BUN: 20 mg/dL (ref 6–20)
CALCIUM: 8.7 mg/dL — AB (ref 8.9–10.3)
CO2: 21 mmol/L — ABNORMAL LOW (ref 22–32)
CREATININE: 2.11 mg/dL — AB (ref 0.61–1.24)
Chloride: 109 mmol/L (ref 101–111)
GFR calc Af Amer: 39 mL/min — ABNORMAL LOW (ref >60)
GFR, EST NON AFRICAN AMERICAN: 34 mL/min — AB (ref >60)
Glucose, Bld: 304 mg/dL — ABNORMAL HIGH (ref 65–99)
POTASSIUM: 4.2 mmol/L (ref 3.5–5.1)
Sodium: 140 mmol/L (ref 135–145)
TOTAL PROTEIN: 6.1 g/dL — AB (ref 6.5–8.1)

## 2015-05-11 LAB — I-STAT CHEM 8, ED
BUN: 22 mg/dL — AB (ref 6–20)
CHLORIDE: 106 mmol/L (ref 101–111)
Calcium, Ion: 1.12 mmol/L (ref 1.12–1.23)
Creatinine, Ser: 1.9 mg/dL — ABNORMAL HIGH (ref 0.61–1.24)
Glucose, Bld: 302 mg/dL — ABNORMAL HIGH (ref 65–99)
HCT: 38 % — ABNORMAL LOW (ref 39.0–52.0)
Hemoglobin: 12.9 g/dL — ABNORMAL LOW (ref 13.0–17.0)
Potassium: 4 mmol/L (ref 3.5–5.1)
SODIUM: 141 mmol/L (ref 135–145)
TCO2: 22 mmol/L (ref 0–100)

## 2015-05-11 LAB — ETHANOL

## 2015-05-11 LAB — APTT: aPTT: 29 seconds (ref 24–37)

## 2015-05-11 LAB — I-STAT TROPONIN, ED: TROPONIN I, POC: 0 ng/mL (ref 0.00–0.08)

## 2015-05-11 LAB — DIFFERENTIAL
Basophils Absolute: 0.1 10*3/uL (ref 0.0–0.1)
Basophils Relative: 1 %
EOS ABS: 0.4 10*3/uL (ref 0.0–0.7)
Eosinophils Relative: 6 %
LYMPHS ABS: 2.4 10*3/uL (ref 0.7–4.0)
Lymphocytes Relative: 32 %
MONOS PCT: 7 %
Monocytes Absolute: 0.5 10*3/uL (ref 0.1–1.0)
Neutro Abs: 4 10*3/uL (ref 1.7–7.7)
Neutrophils Relative %: 54 %

## 2015-05-11 LAB — RAPID URINE DRUG SCREEN, HOSP PERFORMED
Amphetamines: NOT DETECTED
BARBITURATES: NOT DETECTED
Benzodiazepines: NOT DETECTED
Cocaine: NOT DETECTED
Opiates: NOT DETECTED
Tetrahydrocannabinol: NOT DETECTED

## 2015-05-11 LAB — PROTIME-INR
INR: 0.93 (ref 0.00–1.49)
PROTHROMBIN TIME: 12.7 s (ref 11.6–15.2)

## 2015-05-11 LAB — GLUCOSE, CAPILLARY
GLUCOSE-CAPILLARY: 156 mg/dL — AB (ref 65–99)
Glucose-Capillary: 123 mg/dL — ABNORMAL HIGH (ref 65–99)

## 2015-05-11 MED ORDER — ACETAMINOPHEN 325 MG PO TABS
650.0000 mg | ORAL_TABLET | ORAL | Status: DC | PRN
  Administered 2015-05-11 – 2015-05-16 (×2): 650 mg via ORAL
  Filled 2015-05-11 (×2): qty 2

## 2015-05-11 MED ORDER — SODIUM CHLORIDE 0.9 % IV SOLN
INTRAVENOUS | Status: DC
  Administered 2015-05-11: 17:00:00 via INTRAVENOUS

## 2015-05-11 MED ORDER — ASPIRIN 325 MG PO TABS
325.0000 mg | ORAL_TABLET | Freq: Every day | ORAL | Status: DC
  Administered 2015-05-11 – 2015-05-12 (×2): 325 mg via ORAL
  Filled 2015-05-11 (×2): qty 1

## 2015-05-11 MED ORDER — PNEUMOCOCCAL VAC POLYVALENT 25 MCG/0.5ML IJ INJ
0.5000 mL | INJECTION | INTRAMUSCULAR | Status: AC
  Administered 2015-05-12: 0.5 mL via INTRAMUSCULAR

## 2015-05-11 MED ORDER — INSULIN GLARGINE 100 UNIT/ML ~~LOC~~ SOLN
10.0000 [IU] | Freq: Every day | SUBCUTANEOUS | Status: DC
  Administered 2015-05-11 – 2015-05-15 (×5): 10 [IU] via SUBCUTANEOUS
  Filled 2015-05-11 (×8): qty 0.1

## 2015-05-11 MED ORDER — STROKE: EARLY STAGES OF RECOVERY BOOK
Freq: Once | Status: AC
  Administered 2015-05-11: 22:00:00

## 2015-05-11 MED ORDER — INSULIN ASPART 100 UNIT/ML ~~LOC~~ SOLN: 0.0000 [IU] | Freq: Every day | SUBCUTANEOUS | Status: DC

## 2015-05-11 MED ORDER — ACETAMINOPHEN 650 MG RE SUPP: 650.0000 mg | RECTAL | Status: DC | PRN

## 2015-05-11 MED ORDER — HYDRALAZINE HCL 20 MG/ML IJ SOLN
10.0000 mg | Freq: Four times a day (QID) | INTRAMUSCULAR | Status: DC | PRN
  Administered 2015-05-11: 10 mg via INTRAVENOUS
  Filled 2015-05-11: qty 1

## 2015-05-11 MED ORDER — ENOXAPARIN SODIUM 40 MG/0.4ML ~~LOC~~ SOLN
40.0000 mg | SUBCUTANEOUS | Status: DC
  Administered 2015-05-11 – 2015-05-16 (×6): 40 mg via SUBCUTANEOUS
  Filled 2015-05-11 (×6): qty 0.4

## 2015-05-11 MED ORDER — INSULIN ASPART 100 UNIT/ML ~~LOC~~ SOLN
0.0000 [IU] | Freq: Three times a day (TID) | SUBCUTANEOUS | Status: DC
  Administered 2015-05-11: 3 [IU] via SUBCUTANEOUS
  Administered 2015-05-12 (×3): 2 [IU] via SUBCUTANEOUS
  Administered 2015-05-13: 3 [IU] via SUBCUTANEOUS
  Administered 2015-05-14: 2 [IU] via SUBCUTANEOUS
  Administered 2015-05-14 – 2015-05-15 (×3): 3 [IU] via SUBCUTANEOUS
  Administered 2015-05-15 – 2015-05-16 (×3): 2 [IU] via SUBCUTANEOUS

## 2015-05-11 MED ORDER — SODIUM CHLORIDE 0.9 % IV BOLUS (SEPSIS)
1000.0000 mL | Freq: Once | INTRAVENOUS | Status: AC
  Administered 2015-05-11: 1000 mL via INTRAVENOUS

## 2015-05-11 MED ORDER — LORAZEPAM 2 MG/ML IJ SOLN
2.0000 mg | INTRAMUSCULAR | Status: DC | PRN
  Administered 2015-05-12: 2 mg via INTRAMUSCULAR
  Filled 2015-05-11: qty 1

## 2015-05-11 MED ORDER — NIFEDIPINE ER 60 MG PO TB24
120.0000 mg | ORAL_TABLET | Freq: Every day | ORAL | Status: DC
  Filled 2015-05-11: qty 2

## 2015-05-11 MED ORDER — ATORVASTATIN CALCIUM 80 MG PO TABS
80.0000 mg | ORAL_TABLET | Freq: Every day | ORAL | Status: DC
Start: 2015-05-11 — End: 2015-05-16
  Administered 2015-05-11 – 2015-05-16 (×6): 80 mg via ORAL
  Filled 2015-05-11 (×6): qty 1

## 2015-05-11 MED ORDER — INFLUENZA VAC SPLIT QUAD 0.5 ML IM SUSY
0.5000 mL | PREFILLED_SYRINGE | INTRAMUSCULAR | Status: AC
  Administered 2015-05-12: 0.5 mL via INTRAMUSCULAR
  Filled 2015-05-11: qty 0.5

## 2015-05-11 MED ORDER — LAMOTRIGINE 100 MG PO TABS
200.0000 mg | ORAL_TABLET | Freq: Every day | ORAL | Status: DC
  Administered 2015-05-11 – 2015-05-12 (×2): 200 mg via ORAL
  Filled 2015-05-11 (×2): qty 2

## 2015-05-11 NOTE — H&P (Signed)
Family Medicine Teaching [facility] Admission History and Physical Service Pager: [phone]  Patient name: Donald Marks Medical record number: [id number] Date of birth: Oct 17, 1960 Age: 55 y.o. Gender: male  Primary Care Provider: No primary care provider on file. Consultants: Neurology Code Status: Full (per discussion on admission)  Chief Complaint: left sided weakness  Assessment and Plan: Donald Marks is a 55 y.o. male presenting with left hemiparesis, found to have CVA. PMH is significant for diabetes, hypertension, CAD, recent MI with stent placement, seizures and ?Pancreatic cancer.   Acute CVA: Patient presenting with L hemiparesis since night of 2/8, had multiple falls this morning secondary to difficulty ambulating with weak left leg. No head trauma. MRI reveals acute CVA of right putamen and caudate. Neurological exam reveals 4/5 weakness on the left upper and lower extremity with pronator drift of the left arm, otherwise neurological exam WNL. Patient has passed swallow screen. EKG showing LVH and possible Q wave in lead 3.  - Admit to FPTS under Dr Lum Babe - Telemetry - c/s neuro stroke team, appreciate recs - A1c, Lipid panel pending - Echo ordered - ASA 325 mg daily, will likely switch to Plavix for antiplatelet therapy - Neuro checks q2 - c/s SLP, PT, OT - Carotid U/S - Permissive HTN up to 220/120  AKI: unsure of baseline creatinine, however, around 0.9 nine years ago. 2.11 on admission down to 1.9 after bolus. Urinalysis significant for glucose and protein. - recheck BMP in AM - MIVF overnight; likely transition to oral intake only in the morning  Hypertension: Takes Nifedipine 120 mg daily, Lasix 80 mg daily, KDUR 20 meq BID. Has not had any medications today. Currently hypertensive at 172/93.  - Hold antihypertensives for now - Permissive hypertension for now as above  Diabetes Mellitus: Pt unsure of what medications he takes at home. Per Epic he takes;  Novolog 60 units TID, Lantus 15-18 units qhs, and Humalog 1-10 units TID. - Hold home medications   - Lantus 10 units qhs - Moderate SSI - CBGs - A1C pending  Seizure Disorder: Patient states that a couple weeks ago his antiepileptic was changed to Lamictal. His last seizure was a couple months ago.  - Continue Lamictal 200 mg daily - Lamotrigine level - Neurology on board  CAD: Pt reports having triple heart bypass years ago and an MI on 04/29/15. Is taking daily ASA 325 mg. Denies any cardiac symptoms at this time.  - Continue home ASA   - Telemetry as above  FEN/GI: NS @ 100 cc/hr, heart healthy carb modified diet Prophylaxis: Lovenox  Disposition: Admit to inpatient FPTS, attending Dr. Lum Babe  History of Present Illness:  Donald Marks is a 55 y.o. male presenting with left sided weakness. Last night, around 7pm, he had weakness in in his left arm and left leg, and at that time almost fell, without symptoms of lightheadedness or feeling of blacking out. This morning, he had a fall. He called the [place] today and was initially told to go to [place], but was then instructed to call EMS for transport to the nearest hospital. He has no dysarthria, chest pain, dyspnea, palpitations, headaches, vision changes, tinnitus, nausea, vomiting, diarrhea, constipation, dysuria, frequency, hesitancy. He has a history of numbness in his legs.   Review Of Systems: Per HPI with the following additions: see HPI Otherwise the remainder of the systems were negative.  There are no active problems to display for this patient.   Past Medical History: Past Medical History  Diagnosis Date  . MI (myocardial infarction) (HCC)   . Diabetes mellitus without complication (HCC)   . Hypertension   . Coronary artery disease   . Seizure (HCC)   . CVA (cerebral infarction)    CVA 04/30/2015  Past Surgical History: Past Surgical History  Procedure Laterality Date  . Coronary artery bypass graft    . Cardiac  catheterization    . Cholecystectomy    . Mouth surgery      Social History: Social History  Substance Use Topics  . Smoking status: Never Smoker   . Smokeless tobacco: Not on file  . Alcohol Use: No   Additional social history: none Please also refer to relevant sections of EMR.  Family History: Family History  Problem Relation Age of Onset  . Hyperlipidemia Mother   . Hypertension Father   . Hypertension Mother   . Hyperlipidemia Father   . Aneurysm Mother   . Aneurysm Maternal Grandmother   . Hypertension Maternal Grandmother   . Diabetes Father     Allergies and Medications: Allergies  Allergen Reactions  . Contrast Media [Iodinated Diagnostic Agents]    No current facility-administered medications on file prior to encounter.   No current outpatient prescriptions on file prior to encounter.    Objective: BP 172/93 mmHg  Pulse 64  Temp(Src) 97.7 F (36.5 C) (Oral)  Resp 16  Ht 5\' 11"  (1.803 m)  Wt 244 lb (110.678 kg)  BMI 34.05 kg/m2  SpO2 96%  Exam: General: In NAD, laying in bed Eyes: PERRLA, non icteric sclera ENTM: moist mucous membranes Neck: supple Cardiovascular: regular rate and rhythm, normal s1 and s2, no murmurs Respiratory: normal work of breathing, clear to auscultation bilaterally Abdomen: soft, non distended, non tender, no masses, normal bowel sounds MSK: no joint pain Skin: no rashes. Healed scar on chest from open heart surgery and ~4 smaller scars on abdomen. Neuro: CN 2-12 intact, 4/5 strength in bilateral left UE and LE. 5/5 strength in right UE and LE. Sensation intact throughout. Pronator drift of left arm Psych: normal mood and affect  Labs and Imaging: CBC BMET   Recent Labs Lab 05/11/15 1135 05/11/15 1137  WBC 7.5  --   HGB 12.1* 12.9*  HCT 36.6* 38.0*  PLT 325  --     Recent Labs Lab 05/11/15 1135 05/11/15 1137  NA 140 141  K 4.2 4.0  CL 109 106  CO2 21*  --   BUN 20 22*  CREATININE 2.11* 1.90*  GLUCOSE  304* 302*  CALCIUM 8.7*  --      Mr Brain Wo Contrast  05/11/2015  CLINICAL DATA:  55 year old male with sudden onset left side weakness since last night. Symptom onset 2100 hours. Initial encounter. EXAM: MRI HEAD WITHOUT CONTRAST TECHNIQUE: Multiplanar, multiecho pulse sequences of the brain and surrounding structures were obtained without intravenous contrast. COMPARISON:  Head CT without contrast 07/10/2006. FINDINGS: 9 mm oval area of restricted diffusion in the posterior right corona radiata tracking to the putamen. No other convincing restricted diffusion. Subtle signal abnormality along the left ventral brainstem on series 3, image 18 does not look suspicious on thin coronal imaging (series 5, image 9). Major intracranial vascular flow voids are preserved. There are multifocal chronic deep gray matter and corona radiata lacunar infarcts superimposed on the acute finding, more so in the right hemisphere. The brainstem and cerebellum appear spared. No associated cerebral cortical encephalomalacia. Occasional chronic micro hemorrhages. No midline shift, mass effect, evidence of mass  lesion, ventriculomegaly, extra-axial collection or acute intracranial hemorrhage. Cervicomedullary junction and pituitary are within normal limits. Negative visualized cervical spine. Normal bone marrow signal. Visible internal auditory structures appear normal. Mastoids are clear. Trace mucosal thickening in the paranasal sinuses. Postoperative changes to the left globe. Otherwise negative orbit and scalp soft tissues. IMPRESSION: 1. Acute on chronic small vessel infarct affecting the right corona radiata and putamen. No associated mass effect or hemorrhage. 2. Numerous underlying chronic lacunar infarcts in the bilateral deep gray matter nuclei (MCA and PCA territories). Electronically Signed   By: Odessa Fleming M.D.   On: 05/11/2015 12:35     Donald Dinning, MD 05/11/2015, 1:20 PM PGY-1, [facility] FPTS  Intern pager: [phone], text pages welcome  I have seen and examined the patient. I have read and agree with the above note. My changes are noted in blue.  Donald Hawking, MD PGY-3, [facility] 05/11/2015, 10:39 PM

## 2015-05-11 NOTE — Consult Note (Signed)
Requesting Physician: Dr. Pecola Leisure    Chief Complaint:CVA  HPI:                                                                                                                                         Donald Marks is an 55 y.o. male with past medical history of diabetes, hypertension, CAD, recent MI with stent placement. Patient states that last night at 9 PM he was going up the stairs when he noticed that his left leg and left arm were weak. He states that he just had difficulty lifting the leg to get up to the next stair. Patient woke up today and has fallen 7 times due to the leg being weak and giving out. He states that he feels off balance but denies vertigo. Due to continues left sided weakness he came to ED. He denies smoking or drinking. States BP was 114/80 yesterday. Has not taken his ASA in 7 days    Past Medical History  Diagnosis Date  . MI (myocardial infarction) (HCC)   . Diabetes mellitus without complication (HCC)   . Hypertension   . Coronary artery disease     Past Surgical History  Procedure Laterality Date  . Coronary artery bypass graft    . Cardiac catheterization    . Cholecystectomy      Family History  Problem Relation Age of Onset  . Hyperlipidemia Mother   . Hypertension Father   . Hypertension Mother   . Hyperlipidemia Father    Social History:  reports that he has never smoked. He does not have any smokeless tobacco history on file. He reports that he does not drink alcohol. His drug history is not on file.  Allergies:  Allergies  Allergen Reactions  . Contrast Media [Iodinated Diagnostic Agents]     Medications:                                                                                                                           No current facility-administered medications for this encounter.   Current Outpatient Prescriptions  Medication Sig Dispense Refill  . aspirin 325 MG tablet Take 325 mg by mouth daily.    Marland Kitchen atorvastatin (LIPITOR)  80 MG tablet Take 80 mg by mouth daily.    . Cholecalciferol (VITAMIN D3) 2000 units capsule Take 2,000 Units by mouth daily.    Marland Kitchen  furosemide (LASIX) 80 MG tablet Take 40 mg by mouth daily.    . insulin aspart (NOVOLOG) 100 UNIT/ML injection Inject 60 Units into the skin 3 (three) times daily before meals. Uses sliding scale    . insulin glargine (LANTUS) 100 UNIT/ML injection Inject 15-18 Units into the skin at bedtime. Patient uses sliding scale    . insulin lispro (HUMALOG) 100 UNIT/ML injection Inject 0-10 Units into the skin 3 (three) times daily before meals.     . lamoTRIgine (LAMICTAL) 200 MG tablet Take 200 mg by mouth daily.    . magnesium oxide (MAG-OX) 400 MG tablet Take 400 mg by mouth daily.    Marland Kitchen NIFEdipine (PROCARDIA-XL/ADALAT CC) 60 MG 24 hr tablet Take 120 mg by mouth daily.    . potassium chloride SA (K-DUR,KLOR-CON) 20 MEQ tablet Take 20 mEq by mouth 2 (two) times daily.       ROS:                                                                                                                                       History obtained from the patient  General ROS: negative for - chills, fatigue, fever, night sweats, weight gain or weight loss Psychological ROS: negative for - behavioral disorder, hallucinations, memory difficulties, mood swings or suicidal ideation Ophthalmic ROS: negative for - blurry vision, double vision, eye pain or loss of vision ENT ROS: negative for - epistaxis, nasal discharge, oral lesions, sore throat, tinnitus or vertigo Allergy and Immunology ROS: negative for - hives or itchy/watery eyes Hematological and Lymphatic ROS: negative for - bleeding problems, bruising or swollen lymph nodes Endocrine ROS: negative for - galactorrhea, hair pattern changes, polydipsia/polyuria or temperature intolerance Respiratory ROS: negative for - cough, hemoptysis, shortness of breath or wheezing Cardiovascular ROS: negative for - chest pain, dyspnea on exertion,  edema or irregular heartbeat Gastrointestinal ROS: negative for - abdominal pain, diarrhea, hematemesis, nausea/vomiting or stool incontinence Genito-Urinary ROS: negative for - dysuria, hematuria, incontinence or urinary frequency/urgency Musculoskeletal ROS: negative for - joint swelling or muscular weakness Neurological ROS: as noted in HPI Dermatological ROS: negative for rash and skin lesion changes  Neurologic Examination:                                                                                                      Blood pressure 172/93, pulse 64, temperature 97.7 F (36.5 C), temperature source Oral, resp. rate 16, height  (1.803 m), weight 110.678 kg (244  lb), SpO2 96 %.  HEENT-  Normocephalic, no lesions, without obvious abnormality.  Normal external eye and conjunctiva.  Normal TM's bilaterally.  Normal auditory canals and external ears. Normal external nose, mucus membranes and septum.  Normal pharynx. Cardiovascular- S1, S2 normal, pulses palpable throughout   Lungs- chest clear, no wheezing, rales, normal symmetric air entry Abdomen- normal findings: bowel sounds normal Extremities- no edema Lymph-no adenopathy palpable Musculoskeletal-no joint tenderness, deformity or swelling Skin-warm and dry, no hyperpigmentation, vitiligo, or suspicious lesions  Neurological Examination Mental Status: Alert, oriented, thought content appropriate.  Speech fluent without evidence of aphasia.  Able to follow 3 step commands without difficulty. Cranial Nerves: II: Discs flat bilaterally; Visual fields grossly normal, pupils equal, round, reactive to light and accommodation III,IV, VI: ptosis not present, extra-ocular motions intact bilaterally V,VII: smile asymmetric on left, facial light touch sensation normal bilaterally VIII: hearing normal bilaterally IX,X: uvula rises symmetrically XI: bilateral shoulder shrug XII: midline tongue extension Motor: Right : Upper extremity    5/5    Left:     Upper extremity   4/5  Lower extremity   5/5     Lower extremity   4/5 Tone and bulk:normal tone throughout; no atrophy noted Sensory: Pinprick and light touch intact throughout, bilaterally Deep Tendon Reflexes: 2+ and symmetric throughout Plantars: Right: downgoing   Left: downgoing Cerebellar: normal finger-to-nose, normal rapid alternating movements and normal heel-to-shin test Gait: normal gait and station       Lab Results: Basic Metabolic Panel:  Recent Labs Lab 05/11/15 1135 05/11/15 1137  NA 140 141  K 4.2 4.0  CL 109 106  CO2 21*  --   GLUCOSE 304* 302*  BUN 20 22*  CREATININE 2.11* 1.90*  CALCIUM 8.7*  --     Liver Function Tests:  Recent Labs Lab 05/11/15 1135  AST 21  ALT 13*  ALKPHOS 90  BILITOT 0.2*  PROT 6.1*  ALBUMIN 2.9*   No results for input(s): LIPASE, AMYLASE in the last 168 hours. No results for input(s): AMMONIA in the last 168 hours.  CBC:  Recent Labs Lab 05/11/15 1135 05/11/15 1137  WBC 7.5  --   NEUTROABS 4.0  --   HGB 12.1* 12.9*  HCT 36.6* 38.0*  MCV 88.0  --   PLT 325  --     Cardiac Enzymes: No results for input(s): CKTOTAL, CKMB, CKMBINDEX, TROPONINI in the last 168 hours.  Lipid Panel: No results for input(s): CHOL, TRIG, HDL, CHOLHDL, VLDL, LDLCALC in the last 168 hours.  CBG: No results for input(s): GLUCAP in the last 168 hours.  Microbiology: Results for orders placed or performed during the hospital encounter of 09/07/06  Culture, blood (routine x 2)     Status: None   Collection Time: 09/07/06  7:02 PM  Result Value Ref Range Status   Specimen Description BLOOD RIGHT ARM  Final   Special Requests   Final    BOTTLES DRAWN AEROBIC AND ANAEROBIC 4CC BOTH BOTTLES   Culture   Final    KLEBSIELLA OZAENAE Note: Gram Stain Report Called to,Read Back By and Verified With: Cherrie Gauze RN 09/08/06 AT 1545 BY [facility]   Report Status 09/10/2006 FINAL  Final   Organism ID, Bacteria KLEBSIELLA  OZAENAE  Final      Susceptibility   Klebsiella ozaenae - MIC    AMPICILLIN >=32 Resistant     AMPICILLIN/SULBACTAM 4 Sensitive     CEFAZOLIN <=4 Sensitive     CEFEPIME <=1  Sensitive     CEFOTETAN <=4 Sensitive     CEFTAZIDIME <=1 Sensitive     CEFTRIAXONE <=1 Sensitive     CIPROFLOXACIN <=0.25 Sensitive     GENTAMICIN <=1 Sensitive     IMIPENEM <=1 Sensitive     PIP/TAZO <=4 Sensitive     TOBRAMYCIN <=1 Sensitive     TRIMETH/SULFA <=20 Sensitive   Culture, blood (routine x 2)     Status: None   Collection Time: 09/07/06  7:22 PM  Result Value Ref Range Status   Specimen Description BLOOD RIGHT HAND  Final   Special Requests BOTTLES DRAWN AEROBIC ONLY 3CC  Final   Culture   Final    KLEBSIELLA OZAENAE Note: SUSCEPTIBILITIES PERFORMED ON PREVIOUS CULTURE WITHIN THE LAST 5 DAYS.   Report Status 09/10/2006 FINAL  Final  Pathologist smear review     Status: None   Collection Time: 09/11/06  4:50 AM  Result Value Ref Range Status   Tech Review   Final    Reviewed by Beulah Gandy. Luisa Hart, M.D. NEUTROPHILIA MARKEDLY INCREASED PLATELET COUNT OF >900,000. PRIMARY HEMATOLOGIC DISORDER NOT EXCLUDED. SUGGEST FULL HEMATOLOGIC EVALUATION, IF CLINICALLY INDICATED.  Anaerobic culture     Status: None   Collection Time: 09/12/06 10:30 AM  Result Value Ref Range Status   Specimen Description ASPIRATE GB FOSSA  Final   Special Requests NONE  Final   Gram Stain   Final    MODERATE WBC PRESENT,BOTH PMN AND MONONUCLEAR RARE SQUAMOUS EPITHELIAL CELLS PRESENT NO ORGANISMS SEEN   Culture NO ANAEROBES ISOLATED  Final   Report Status 09/17/2006 FINAL  Final  Wound culture     Status: None   Collection Time: 09/12/06 10:30 AM  Result Value Ref Range Status   Specimen Description ASPIRATE GB FOSSA  Final   Special Requests NONE  Final   Gram Stain   Final    MODERATE WBC PRESENT,BOTH PMN AND MONONUCLEAR NO SQUAMOUS EPITHELIAL CELLS SEEN NO ORGANISMS SEEN   Culture MODERATE ENTEROBACTER CLOACAE   Final   Report Status 09/15/2006 FINAL  Final   Organism ID, Bacteria ENTEROBACTER CLOACAE  Final      Susceptibility   Enterobacter cloacae - MIC    CEFAZOLIN >=64 Resistant     CEFEPIME <=1 Sensitive     CEFOTETAN <=4 Resistant     CEFTAZIDIME <=1 Sensitive     CEFTRIAXONE <=1 Sensitive     CIPROFLOXACIN <=0.25 Sensitive     GENTAMICIN <=1 Sensitive     IMIPENEM <=1 Sensitive     PIP/TAZO <=4 Sensitive     TOBRAMYCIN <=1 Sensitive     TRIMETH/SULFA <=20 Sensitive     Coagulation Studies:  Recent Labs  05/11/15 1135  LABPROT 12.7  INR 0.93    Imaging: Mr Brain Wo Contrast  05/11/2015  CLINICAL DATA:  55 year old male with sudden onset left side weakness since last night. Symptom onset 2100 hours. Initial encounter. EXAM: MRI HEAD WITHOUT CONTRAST TECHNIQUE: Multiplanar, multiecho pulse sequences of the brain and surrounding structures were obtained without intravenous contrast. COMPARISON:  Head CT without contrast 07/10/2006. FINDINGS: 9 mm oval area of restricted diffusion in the posterior right corona radiata tracking to the putamen. No other convincing restricted diffusion. Subtle signal abnormality along the left ventral brainstem on series 3, image 18 does not look suspicious on thin coronal imaging (series 5, image 9). Major intracranial vascular flow voids are preserved. There are multifocal chronic deep gray matter and corona radiata lacunar infarcts superimposed on  the acute finding, more so in the right hemisphere. The brainstem and cerebellum appear spared. No associated cerebral cortical encephalomalacia. Occasional chronic micro hemorrhages. No midline shift, mass effect, evidence of mass lesion, ventriculomegaly, extra-axial collection or acute intracranial hemorrhage. Cervicomedullary junction and pituitary are within normal limits. Negative visualized cervical spine. Normal bone marrow signal. Visible internal auditory structures appear normal. Mastoids are clear. Trace  mucosal thickening in the paranasal sinuses. Postoperative changes to the left globe. Otherwise negative orbit and scalp soft tissues. IMPRESSION: 1. Acute on chronic small vessel infarct affecting the right corona radiata and putamen. No associated mass effect or hemorrhage. 2. Numerous underlying chronic lacunar infarcts in the bilateral deep gray matter nuclei (MCA and PCA territories). Electronically Signed   By: Odessa Fleming M.D.   On: 05/11/2015 12:35       Assessment and plan discussed with with attending physician and they are in agreement.    Felicie Morn PA-C [facility] [phone]  05/11/2015, 1:04 PM   Assessment: 55 y.o. male with acute right corona radiata infarct. Out of window for tPA. Exam is positive for left sided weakness. Given his Stroke risk factors he would benefit from stroke work up.   Stroke Risk Factors - diabetes mellitus, hyperlipidemia and hypertension  Recommend: 1. HgbA1c, fasting lipid panel 2. MRI, MRA  of the brain without contrast 3. PT consult, OT consult, Speech consult 4. Echocardiogram 5. Carotid dopplers 6. Prophylactic therapy-Antiplatelet med: Aspirin - dose 325 mg daily 7. Risk factor modification 8. Telemetry monitoring 9. Frequent neuro checks 10 NPO until passes stroke swallow screen 11 please page stroke NP  Or  PA  Or MD  M-F from 8am -4 pm as he will be followed by the stroke team at this point.   You can look them up on [email or web address]  Password [email or web address]

## 2015-05-11 NOTE — Progress Notes (Signed)
Patient complaining of chest pain radiating to the left. MD paged, EKG and Troponins ordered. Monitoring closely. Aivah Putman, [place] Scrape, RN

## 2015-05-11 NOTE — Progress Notes (Signed)
Per family pt has a seizure (upper body shaking and arms) for a couple seconds. In room with charge pt alert X4, drowsy. Dr. Jonathon Jordan paged. Awaiting call back, 02 SATS 100% BP 229/113. 10 mg of hydralazine given.

## 2015-05-11 NOTE — Progress Notes (Signed)
Patient with witnessed seizure at shift change. MD at bedside. Seizure precautions initiated. Family's questions answered. Patient fatigued but able to answer questions and follow all commands. Monitoring closely. Kambryn Dapolito, [place] Scrape, RN

## 2015-05-11 NOTE — Progress Notes (Signed)
FPTS Interim Progress Note  S: Called to bedside by RN for seizure that was witnessed by family members in patient's room. According to family members, seizure lasted ~10 seconds and consisted of head turning to the right side and some hand tremors. No incontinence or vomiting witnessed. Patient appears drowsy and post-ictal at this time but was still communicative. States he feels "run down".  O: BP 219/101 mmHg  Pulse 64  Temp(Src) 98.4 F (36.9 C) (Oral)  Resp 20  Ht  (1.803 m)  Wt 244 lb (110.678 kg)  BMI 34.05 kg/m2  SpO2 100%  Gen: In NAD, drowsy, laying in bed with bed tilted upwards ~70 degrees Neuro: Alert and oriented to person, place, and time. Could state the president's name. CN 2-12 intact, 4/5 strength in bilateral left UE and LE. 5/5 strength in right UE and LE. Sensation intact throughout  A/P: Donald Marks is a 55 y.o. male presenting with left hemiparesis, found to have CVA. Has hx of seizure disorder and currently taking Lamictal 200 mg daily. Currently post-ictal from ~10 second seizure-like activity witnessed by family. Alert and oriented x 3 and no new/worsening neurological deficits which is reassuring.  - Ativan 2 mg IM PRN for active seizure - Seizure precautions - Continue neuro checks q 2 - Monitor for further seizure like activity - Neurology will see in the morning - Lamotrigine level pending  Beaulah Dinning, MD 05/11/2015, 7:24 PM PGY-1, [facility] pager [phone]

## 2015-05-11 NOTE — ED Notes (Signed)
Admitting MD at bedside.

## 2015-05-11 NOTE — ED Notes (Signed)
Attempted report. Floor reports that room is not clean at this time.

## 2015-05-11 NOTE — ED Notes (Signed)
MD aware of pt's blood pressure, no new orders at this time. Instructed to monitor pt but not concerned until systolic of 220.

## 2015-05-11 NOTE — ED Notes (Signed)
Attempted report X2 

## 2015-05-11 NOTE — Progress Notes (Signed)
Pt BP  219/101, asymptomatic. Spoke with Dr. Jonathon Jordan family medicine,  Dr. Stann Mainland put in an order for hydralazine PRN for BP over 220/120.

## 2015-05-11 NOTE — ED Notes (Signed)
Attempted report 

## 2015-05-11 NOTE — ED Provider Notes (Signed)
CSN: [id number]     Arrival date & time 05/11/15  1000 History   First MD Initiated Contact with Patient 05/11/15 1004     Chief Complaint  Patient presents with  . Extremity Weakness     (Consider location/radiation/quality/duration/timing/severity/associated sxs/prior Treatment) Patient is a 55 y.o. male presenting with extremity weakness.  Extremity Weakness    Donald Marks Is a 55 year old male with past medical history of diabetes, hypertension, CAD, recent MI with stent placement. Patient states that last night at 9 PM he was going up the stairs when he noticed that his left leg and left arm were weak. He states that he just had difficulty lifting the leg to get up to the next stair. Patient woke up today and has fallen 7 times due to the leg being weak and giving out. He states that he feels off balance but denies vertigo. Patient has a history of previous CVA without any deficits. He is hypertensive upon arrival. He denies difficulty with speech, swallowing, headache, chest pain, shortness of breath, changes in vision. No recent fever, chills. He denies racing or skipping in his heart. Patient had an MI on 1/29 and had a stent placed. He states that it was done at [facility], but I was unable to find records. He is a poor historian.  Past Medical History  Diagnosis Date  . MI (myocardial infarction) (HCC)   . Diabetes mellitus without complication (HCC)   . Hypertension   . Coronary artery disease    Past Surgical History  Procedure Laterality Date  . Coronary artery bypass graft    . Cardiac catheterization    . Cholecystectomy     No family history on file. Social History  Substance Use Topics  . Smoking status: Never Smoker   . Smokeless tobacco: Not on file  . Alcohol Use: No    Review of Systems  Musculoskeletal: Positive for extremity weakness.    Ten systems reviewed and are negative for acute change, except as noted in the HPI.    Allergies  Contrast  media  Home Medications   Prior to Admission medications   Not on File   BP 130/99 mmHg  Pulse 62  Temp(Src) 97.7 F (36.5 C) (Oral)  Resp 17  Ht  (1.803 m)  Wt 110.678 kg  BMI 34.05 kg/m2  SpO2 97% Physical Exam  Constitutional: He appears well-developed and well-nourished. No distress.  HENT:  Head: Normocephalic and atraumatic.  Eyes: Conjunctivae and EOM are normal. Pupils are equal, round, and reactive to light. No scleral icterus.  Neck: Normal range of motion. Neck supple.  Cardiovascular: Normal rate, regular rhythm and normal heart sounds.   Pulmonary/Chest: Effort normal and breath sounds normal. No respiratory distress.  Abdominal: Soft. There is no tenderness.  Musculoskeletal: He exhibits no edema.  Neurological: He is alert.  Speech is clear and goal oriented, follows commands Major Cranial nerves without deficit, no facial droop Significant weakness in left upper and lower extremities as compared to Right Sensation normal to light and sharp touch Moves extremities without ataxia, coordination intact Neg romberg, no pronator drift Fall risk, gait deffered Normal heel-shin and balance   Skin: Skin is warm and dry. He is not diaphoretic.  Psychiatric: His behavior is normal.  Nursing note and vitals reviewed.   ED Course  Procedures (including critical care time) Labs Review Labs Reviewed - No data to display  Imaging Review No results found. I have personally reviewed and evaluated these images  and lab results as part of my medical decision-making.   EKG Interpretation None      MDM   Final diagnoses:  None    12:53 PM Patient with hyperglycemia, Creatinie is elevated and no baseline comparison.  Troponin is negative.  the MRI shows an acute infarction. I have placed a call to neuro.  Patient with acute stroke. HE will be admitted for stroke work up. Stable throughout visit.    Arthor Captain, PA-C 05/14/15 1826  Tilden Fossa, MD 05/22/15 [phone]

## 2015-05-11 NOTE — ED Notes (Signed)
[facility]- pt coming from home with left side weakness. Pt reports he fell 7 times last night due to weakness. Pt is alert and oriented, denies dizziness. Hypertensive with EMS at 180/90.

## 2015-05-12 ENCOUNTER — Observation Stay (HOSPITAL_COMMUNITY): Payer: Non-veteran care

## 2015-05-12 ENCOUNTER — Ambulatory Visit (HOSPITAL_COMMUNITY): Payer: Non-veteran care

## 2015-05-12 DIAGNOSIS — E1165 Type 2 diabetes mellitus with hyperglycemia: Secondary | ICD-10-CM | POA: Diagnosis present

## 2015-05-12 DIAGNOSIS — W182XXA Fall in (into) shower or empty bathtub, initial encounter: Secondary | ICD-10-CM | POA: Diagnosis not present

## 2015-05-12 DIAGNOSIS — Z7982 Long term (current) use of aspirin: Secondary | ICD-10-CM | POA: Diagnosis not present

## 2015-05-12 DIAGNOSIS — I252 Old myocardial infarction: Secondary | ICD-10-CM | POA: Diagnosis not present

## 2015-05-12 DIAGNOSIS — I639 Cerebral infarction, unspecified: Secondary | ICD-10-CM

## 2015-05-12 DIAGNOSIS — R29702 NIHSS score 2: Secondary | ICD-10-CM | POA: Diagnosis present

## 2015-05-12 DIAGNOSIS — R296 Repeated falls: Secondary | ICD-10-CM | POA: Diagnosis present

## 2015-05-12 DIAGNOSIS — Z951 Presence of aortocoronary bypass graft: Secondary | ICD-10-CM | POA: Diagnosis not present

## 2015-05-12 DIAGNOSIS — G8194 Hemiplegia, unspecified affecting left nondominant side: Secondary | ICD-10-CM | POA: Diagnosis present

## 2015-05-12 DIAGNOSIS — I638 Other cerebral infarction: Secondary | ICD-10-CM | POA: Diagnosis present

## 2015-05-12 DIAGNOSIS — Z79899 Other long term (current) drug therapy: Secondary | ICD-10-CM | POA: Diagnosis not present

## 2015-05-12 DIAGNOSIS — Z955 Presence of coronary angioplasty implant and graft: Secondary | ICD-10-CM | POA: Diagnosis not present

## 2015-05-12 DIAGNOSIS — Z8249 Family history of ischemic heart disease and other diseases of the circulatory system: Secondary | ICD-10-CM | POA: Diagnosis not present

## 2015-05-12 DIAGNOSIS — N179 Acute kidney failure, unspecified: Secondary | ICD-10-CM | POA: Diagnosis present

## 2015-05-12 DIAGNOSIS — R402142 Coma scale, eyes open, spontaneous, at arrival to emergency department: Secondary | ICD-10-CM | POA: Diagnosis present

## 2015-05-12 DIAGNOSIS — E669 Obesity, unspecified: Secondary | ICD-10-CM | POA: Diagnosis present

## 2015-05-12 DIAGNOSIS — I161 Hypertensive emergency: Secondary | ICD-10-CM | POA: Diagnosis present

## 2015-05-12 DIAGNOSIS — Z833 Family history of diabetes mellitus: Secondary | ICD-10-CM | POA: Diagnosis not present

## 2015-05-12 DIAGNOSIS — R402362 Coma scale, best motor response, obeys commands, at arrival to emergency department: Secondary | ICD-10-CM | POA: Diagnosis present

## 2015-05-12 DIAGNOSIS — Z91041 Radiographic dye allergy status: Secondary | ICD-10-CM | POA: Diagnosis not present

## 2015-05-12 DIAGNOSIS — R809 Proteinuria, unspecified: Secondary | ICD-10-CM | POA: Diagnosis not present

## 2015-05-12 DIAGNOSIS — Z794 Long term (current) use of insulin: Secondary | ICD-10-CM | POA: Diagnosis not present

## 2015-05-12 DIAGNOSIS — E119 Type 2 diabetes mellitus without complications: Secondary | ICD-10-CM | POA: Diagnosis not present

## 2015-05-12 DIAGNOSIS — R531 Weakness: Secondary | ICD-10-CM | POA: Diagnosis present

## 2015-05-12 DIAGNOSIS — Z6834 Body mass index (BMI) 34.0-34.9, adult: Secondary | ICD-10-CM | POA: Diagnosis not present

## 2015-05-12 DIAGNOSIS — E1151 Type 2 diabetes mellitus with diabetic peripheral angiopathy without gangrene: Secondary | ICD-10-CM | POA: Diagnosis present

## 2015-05-12 DIAGNOSIS — R569 Unspecified convulsions: Secondary | ICD-10-CM | POA: Diagnosis not present

## 2015-05-12 DIAGNOSIS — I2510 Atherosclerotic heart disease of native coronary artery without angina pectoris: Secondary | ICD-10-CM | POA: Diagnosis not present

## 2015-05-12 DIAGNOSIS — Y93E1 Activity, personal bathing and showering: Secondary | ICD-10-CM | POA: Diagnosis not present

## 2015-05-12 DIAGNOSIS — M25461 Effusion, right knee: Secondary | ICD-10-CM | POA: Diagnosis not present

## 2015-05-12 DIAGNOSIS — R4182 Altered mental status, unspecified: Secondary | ICD-10-CM | POA: Diagnosis not present

## 2015-05-12 DIAGNOSIS — I10 Essential (primary) hypertension: Secondary | ICD-10-CM | POA: Diagnosis present

## 2015-05-12 DIAGNOSIS — E785 Hyperlipidemia, unspecified: Secondary | ICD-10-CM | POA: Diagnosis present

## 2015-05-12 DIAGNOSIS — Z23 Encounter for immunization: Secondary | ICD-10-CM | POA: Diagnosis not present

## 2015-05-12 DIAGNOSIS — R251 Tremor, unspecified: Secondary | ICD-10-CM | POA: Diagnosis not present

## 2015-05-12 DIAGNOSIS — Z9049 Acquired absence of other specified parts of digestive tract: Secondary | ICD-10-CM | POA: Diagnosis not present

## 2015-05-12 DIAGNOSIS — R739 Hyperglycemia, unspecified: Secondary | ICD-10-CM | POA: Diagnosis not present

## 2015-05-12 DIAGNOSIS — I63311 Cerebral infarction due to thrombosis of right middle cerebral artery: Secondary | ICD-10-CM | POA: Diagnosis not present

## 2015-05-12 DIAGNOSIS — G40909 Epilepsy, unspecified, not intractable, without status epilepticus: Secondary | ICD-10-CM | POA: Diagnosis present

## 2015-05-12 DIAGNOSIS — R402252 Coma scale, best verbal response, oriented, at arrival to emergency department: Secondary | ICD-10-CM | POA: Diagnosis present

## 2015-05-12 DIAGNOSIS — R748 Abnormal levels of other serum enzymes: Secondary | ICD-10-CM | POA: Diagnosis not present

## 2015-05-12 DIAGNOSIS — I69354 Hemiplegia and hemiparesis following cerebral infarction affecting left non-dominant side: Secondary | ICD-10-CM | POA: Diagnosis not present

## 2015-05-12 LAB — BASIC METABOLIC PANEL
Anion gap: 8 (ref 5–15)
BUN: 14 mg/dL (ref 6–20)
CO2: 22 mmol/L (ref 22–32)
CREATININE: 1.45 mg/dL — AB (ref 0.61–1.24)
Calcium: 8 mg/dL — ABNORMAL LOW (ref 8.9–10.3)
Chloride: 112 mmol/L — ABNORMAL HIGH (ref 101–111)
GFR calc Af Amer: >60 mL/min (ref >60)
GFR, EST NON AFRICAN AMERICAN: 53 mL/min — AB (ref >60)
GLUCOSE: 165 mg/dL — AB (ref 65–99)
Potassium: 3.8 mmol/L (ref 3.5–5.1)
SODIUM: 142 mmol/L (ref 135–145)

## 2015-05-12 LAB — CBC
HCT: 33.3 % — ABNORMAL LOW (ref 39.0–52.0)
Hemoglobin: 10.5 g/dL — ABNORMAL LOW (ref 13.0–17.0)
MCH: 27.6 pg (ref 26.0–34.0)
MCHC: 31.5 g/dL (ref 30.0–36.0)
MCV: 87.6 fL (ref 78.0–100.0)
PLATELETS: 316 10*3/uL (ref 150–400)
RBC: 3.8 MIL/uL — ABNORMAL LOW (ref 4.22–5.81)
RDW: 13.6 % (ref 11.5–15.5)
WBC: 8.2 10*3/uL (ref 4.0–10.5)

## 2015-05-12 LAB — LIPID PANEL
Cholesterol: 204 mg/dL — ABNORMAL HIGH (ref 0–200)
HDL: 35 mg/dL — ABNORMAL LOW (ref >40)
LDL CALC: 141 mg/dL — AB (ref 0–99)
Total CHOL/HDL Ratio: 5.8 RATIO
Triglycerides: 139 mg/dL (ref <150)
VLDL: 28 mg/dL (ref 0–40)

## 2015-05-12 LAB — GLUCOSE, CAPILLARY
GLUCOSE-CAPILLARY: 141 mg/dL — AB (ref 65–99)
Glucose-Capillary: 171 mg/dL — ABNORMAL HIGH (ref 65–99)
Glucose-Capillary: 150 mg/dL — ABNORMAL HIGH (ref 65–99)

## 2015-05-12 LAB — TROPONIN I

## 2015-05-12 LAB — LAMOTRIGINE LEVEL: Lamotrigine Lvl: NOT DETECTED ug/mL (ref 2.0–20.0)

## 2015-05-12 MED ORDER — LORAZEPAM 2 MG/ML IJ SOLN
2.0000 mg | INTRAMUSCULAR | Status: DC | PRN
  Filled 2015-05-12: qty 1

## 2015-05-12 MED ORDER — NITROGLYCERIN 0.4 MG SL SUBL: 0.4000 mg | SUBLINGUAL_TABLET | SUBLINGUAL | Status: DC | PRN

## 2015-05-12 MED ORDER — LAMOTRIGINE 100 MG PO TABS
300.0000 mg | ORAL_TABLET | Freq: Every day | ORAL | Status: DC
  Administered 2015-05-13 – 2015-05-16 (×4): 300 mg via ORAL
  Filled 2015-05-12 (×4): qty 3

## 2015-05-12 NOTE — Progress Notes (Signed)
OT Cancellation Note  Patient Details Name: [name] MRN: [id number] DOB: 06/01/1960   Cancelled Treatment:    Reason Eval/Treat Not Completed: Patient at procedure or test/ unavailable (Transport present to take pt to vascular). Second attempt for OT eval today, will follow up as time allows.  Gaye Alken M.S., OTR/L Pager: [phone]  05/12/2015, 2:20 PM

## 2015-05-12 NOTE — Evaluation (Signed)
Speech Language Pathology Evaluation Patient Details Name: Donald Marks MRN: [id number] DOB: 04-26-1960 Today's Date: 05/12/2015 Time: 4098-1191 SLP Time Calculation (min) (ACUTE ONLY): 24 min  Problem List:  Patient Active Problem List   Diagnosis Date Noted  . Left hemiplegia (HCC) 05/12/2015  . Acute CVA (cerebrovascular accident) (HCC) 05/11/2015  . Stroke (cerebrum) (HCC) 05/11/2015  . CVA (cerebral infarction) 05/11/2015  . Hyperglycemia   . AKI (acute kidney injury) (HCC)   . Seizures (HCC)   . Uncontrolled type 2 diabetes mellitus with complication, with long-term current use of insulin (HCC)   . Essential hypertension    Past Medical History:  Past Medical History  Diagnosis Date  . MI (myocardial infarction) (HCC)   . Diabetes mellitus without complication (HCC)   . Hypertension   . Coronary artery disease   . Seizure (HCC)   . CVA (cerebral infarction)   . CVA (cerebral vascular accident) (HCC) 05/2015   Past Surgical History:  Past Surgical History  Procedure Laterality Date  . Coronary artery bypass graft    . Cardiac catheterization    . Cholecystectomy    . Mouth surgery     HPI:  55 Y/O M with pmx of MI, DM2, HTN, Seizure, presented with left upper and lower limb weakness which started the night before, associated with mild slurring of his speech,multiple falls.   MRI reveals acute CVA of right putamen and caudate.     Assessment / Plan / Recommendation Clinical Impression  Pt presents with cognitive-communicative function WNL, excluding some elements of mild neglect on left side.  No SLP f/u is warranted.  Pt expressed feelings of hopelessness.  When offered a referral for a chaplain's visit, he accepted.  This clinician called chaplain's office and provided name/room number.  SLP will sign off.     SLP Assessment  Patient does not need any further Speech Lanaguage Pathology Services    Follow Up Recommendations  None          SLP  Evaluation Prior Functioning  Cognitive/Linguistic Baseline: Within functional limits  Lives With: Alone Vocation: Part time employment   Cognition  Overall Cognitive Status: Within Functional Limits for tasks assessed Arousal/Alertness: Awake/alert Orientation Level: Oriented X4 Attention: Selective Selective Attention: Appears intact Memory: Appears intact Awareness: Appears intact Problem Solving: Appears intact    Comprehension  Auditory Comprehension Overall Auditory Comprehension: Appears within functional limits for tasks assessed Visual Recognition/Discrimination Discrimination: Within Function Limits Reading Comprehension Reading Status: Within funtional limits    Expression Expression Primary Mode of Expression: Verbal Verbal Expression Overall Verbal Expression: Appears within functional limits for tasks assessed Written Expression Dominant Hand: Left Written Expression:  (question mild neglect left side)   Oral / Motor  Oral Motor/Sensory Function Overall Oral Motor/Sensory Function: Within functional limits Motor Speech Overall Motor Speech: Appears within functional limits for tasks assessed   GO          Functional Assessment Tool Used: clinical judgment Functional Limitations: Memory Memory Current Status (Y7829): 0 percent impaired, limited or restricted Memory Goal Status (F6213): 0 percent impaired, limited or restricted Memory Discharge Status (Y8657): 0 percent impaired, limited or restricted         Donald Marks Donald Marks 05/12/2015, 1:52 PM

## 2015-05-12 NOTE — Progress Notes (Signed)
STROKE TEAM PROGRESS NOTE   HISTORY OF PRESENT ILLNESS Donald Marks is an 55 y.o. male with past medical history of diabetes, hypertension, CAD, recent MI with stent placement. Patient states that last night at 9 PM  is 05/10/2015 (LKW) he was going up the stairs when he noticed that his left leg and left arm were weak. He states that he just had difficulty lifting the leg to get up to the next stair. Patient woke up today and has fallen 7 times due to the leg being weak and giving out. He states that he feels off balance but denies vertigo. Due to continues left sided weakness he came to ED. He denies smoking or drinking. States BP was 114/80 yesterday. Has not taken his ASA in 7 days. Patient was not administered TPA secondary to delay in arrival. He was admitted for further evaluation and treatment.   SUBJECTIVE (INTERVAL HISTORY) No family is at the bedside.  Overall he feels his condition is stable. Patient reports a life long hx of seizures. States he has seizures at night weekly. On lamictal. Service dog recommended because he lives alone.   OBJECTIVE Temp:  [97.5 F (36.4 C)-98.4 F (36.9 C)] 97.5 F (36.4 C) (02/10 1101) Pulse Rate:  [55-76] 62 (02/10 1101) Cardiac Rhythm:  [-] Normal sinus rhythm (02/10 0700) Resp:  [15-22] 17 (02/10 1101) BP: (169-229)/(91-113) 190/92 mmHg (02/10 1101) SpO2:  [96 %-100 %] 100 % (02/10 1101)  CBC:   Recent Labs Lab 05/11/15 1135 05/11/15 1137 05/12/15 0320  WBC 7.5  --  8.2  NEUTROABS 4.0  --   --   HGB 12.1* 12.9* 10.5*  HCT 36.6* 38.0* 33.3*  MCV 88.0  --  87.6  PLT 325  --  316    Basic Metabolic Panel:   Recent Labs Lab 05/11/15 1135 05/11/15 1137 05/12/15 0320  NA 140 141 142  K 4.2 4.0 3.8  CL 109 106 112*  CO2 21*  --  22  GLUCOSE 304* 302* 165*  BUN 20 22* 14  CREATININE 2.11* 1.90* 1.45*  CALCIUM 8.7*  --  8.0*    Lipid Panel:     Component Value Date/Time   CHOL 204* 05/12/2015 0320   TRIG 139 05/12/2015  0320   HDL 35* 05/12/2015 0320   CHOLHDL 5.8 05/12/2015 0320   VLDL 28 05/12/2015 0320   LDLCALC 141* 05/12/2015 0320   HgbA1c:  Lab Results  Component Value Date   HGBA1C * 09/08/2006    8.5 (NOTE)   The ADA recommends the following therapeutic goals for glycemic   control related to Hgb A1C measurement:   Goal of Therapy:   < 7.0% Hgb A1C   Action Suggested:  > 8.0% Hgb A1C   Ref:  Diabetes Care, 22, Suppl. 1, 1999   Urine Drug Screen:     Component Value Date/Time   LABOPIA NONE DETECTED 05/11/2015 1339   COCAINSCRNUR NONE DETECTED 05/11/2015 1339   LABBENZ NONE DETECTED 05/11/2015 1339   AMPHETMU NONE DETECTED 05/11/2015 1339   THCU NONE DETECTED 05/11/2015 1339   LABBARB NONE DETECTED 05/11/2015 1339      IMAGING  Mr Brain Wo Contrast  05/11/2015  CLINICAL DATA:  55 year old male with sudden onset left side weakness since last night. Symptom onset 2100 hours. Initial encounter. EXAM: MRI HEAD WITHOUT CONTRAST TECHNIQUE: Multiplanar, multiecho pulse sequences of the brain and surrounding structures were obtained without intravenous contrast. COMPARISON:  Head CT without contrast 07/10/2006. FINDINGS: 9 mm  oval area of restricted diffusion in the posterior right corona radiata tracking to the putamen. No other convincing restricted diffusion. Subtle signal abnormality along the left ventral brainstem on series 3, image 18 does not look suspicious on thin coronal imaging (series 5, image 9). Major intracranial vascular flow voids are preserved. There are multifocal chronic deep gray matter and corona radiata lacunar infarcts superimposed on the acute finding, more so in the right hemisphere. The brainstem and cerebellum appear spared. No associated cerebral cortical encephalomalacia. Occasional chronic micro hemorrhages. No midline shift, mass effect, evidence of mass lesion, ventriculomegaly, extra-axial collection or acute intracranial hemorrhage. Cervicomedullary junction and  pituitary are within normal limits. Negative visualized cervical spine. Normal bone marrow signal. Visible internal auditory structures appear normal. Mastoids are clear. Trace mucosal thickening in the paranasal sinuses. Postoperative changes to the left globe. Otherwise negative orbit and scalp soft tissues. IMPRESSION: 1. Acute on chronic small vessel infarct affecting the right corona radiata and putamen. No associated mass effect or hemorrhage. 2. Numerous underlying chronic lacunar infarcts in the bilateral deep gray matter nuclei (MCA and PCA territories). Electronically Signed   By: Odessa Fleming M.D.   On: 05/11/2015 12:35       PHYSICAL EXAM Well built middle-aged African-American male currently not in distress. . Afebrile. Head is nontraumatic. Neck is supple without bruit.    Cardiac exam no murmur or gallop. Lungs are clear to auscultation. Distal pulses are well felt. Neurological Exam :  Awake alert oriented x 3 normal speech and language. Mild left lower face asymmetry. Tongue midline. No drift. Mild diminished fine finger movements on left. Orbits right over left upper extremity. Mild left grip weak.. Normal sensation . Normal coordination. ASSESSMENT/PLAN [name] is a 55 y.o. male with history of hypertension, diabetes, CAD, recent MI with stent placement presenting with left henmiparesis and falls (6-7x during the night). He did not receive IV t-PA due to delay in arrival.   Stroke:  Non-dominant R corona radiata and putamen acute on chronic infarct secondary to small vessel disease source  MRI  R corona radiata and putamen acute on chronic infarct  MRA  Not ordered.  Check TCD  Carotid Doppler  pending   2D Echo  ordered  LDL 141  HgbA1c pending  Lovenox 40 mg sq daily for VTE prophylaxis Diet Carb Modified Fluid consistency:: Thin; Room service appropriate?: Yes  aspirin 81 mg daily prior to admission, now on aspirin 325 mg daily  Patient counseled to be  compliant with his antithrombotic medications  Ongoing aggressive stroke risk factor management  Therapy recommendations:  pending   Disposition:  pending   Seizure  Seizure 05/11/2015 at 657p per family, treated with Ativan  On lamictal 200 daily prior to admisison  Pt reports high frequency of seizure, almost every week at night  Hx head injury as a child   They have recommended a service dog  Increase lamictal to 300 mg daily  Hypertensive Emergency  BP 220/120 on arrival  Elevated today - 178-205/92-98  Hyperlipidemia  Home meds:  lipitor 80, resumed in hospital  LDL 141, goal < 70  Continue statin at discharge  Diabetes  HgbA1c pending , goal < 7.0  Other Stroke Risk Factors  Obesity, Body mass index is 34.05 kg/(m^2).   Coronary artery disease s/p CABG, MI  Other Active Problems  Chest pain   AKI, Cr 2.11 on admission down to 1.9, resolving  Hospital day # 1  [facility]  [facility] See Amion for Pager information 05/12/2015 11:49 AM  I have personally examined this patient, reviewed notes, independently viewed imaging studies, participated in medical decision making and plan of care. I have made any additions or clarifications directly to the above note. Agree with note above.  He presented with left hemiplegia and facial weakness due to right brain subcortical infarct and remains at risk for neurological worsening, recurrent stroke, TIA and seizures and needs ongoing stroke evaluation. Change aspirin to Plavix for stroke prevention. Increase dose of lamotrigine to 300 mg daily as he apparently is having 1 seizure per week. Delia Heady, MD Medical Director [facility] Pager: [phone] 05/12/2015 1:38 PM    To contact Stroke Continuity provider, please refer to [email or web address]. After hours, contact General Neurology Has some months reported healthy

## 2015-05-12 NOTE — Progress Notes (Signed)
Family Medicine Teaching Service Daily Progress Note Intern Pager: [phone]  Patient name: Donald Marks Medical record number: [id number] Date of birth: Oct 12, 1960 Age: 55 y.o. Gender: male  Primary Care Provider: Sondra Come, MD Consultants: Neurology  Code Status: Full   Pt Overview and Major Events to Date:  2/9: Admit to FPTS  Assessment and Plan: Donald Marks is a 55 y.o. male presenting with left hemiparesis, found to have CVA. PMH is significant for diabetes, hypertension, CAD, recent MI with stent placement, seizures and ?Pancreatic cancer.   Acute CVA: Continues to have L hemiparesis since night of 2/8, had multiple falls this morning secondary to difficulty ambulating with weak left leg. No head trauma. MRI reveals acute CVA of right putamen and caudate. Neurological exam reveals 4/5 weakness on the left upper and lower extremity with pronator drift of the left arm, otherwise neurological exam WNL. Patient has passed swallow screen. EKG showing LVH and possible Q wave in lead 3. Echo from 1/28 showing EF 55-60% with concentric LVH, no valvular dysfunction.  - Telemetry - c/s neuro stroke team, appreciate recs - A1c pending - Consider repeat echo based on neuro recommendations - ASA 325 mg daily, will likely switch to Plavix for antiplatelet therapy - Neuro checks q2 - c/s SLP, PT, OT - Carotid U/S - Permissive HTN up to 220/120 for next 24 hours  AKI: unsure of baseline creatinine, however, around 0.9 nine years ago. 2.11 on admission down to 1.9 after bolus. Urinalysis significant for glucose and protein. Cr 1.45 now.  - AM BMPs - IVF discontinued  Hyperlipidemia: Lipid panel mildly elevated cholesterol of 204 and elevated LDL of 141 - Continue Atorvastatin 80 mg daily  Hypertension: Takes Nifedipine 120 mg daily, Lasix 80 mg daily, KDUR 20 meq BID. Has not had any medications today. Currently hypertensive at 169/97.  - Hold antihypertensives for now - Permissive  hypertension for now as above - Hydralazine PRN for BP >220/120  Diabetes Mellitus: Pt unsure of what medications he takes at home. Per Epic he takes; Novolog 60 units TID, Lantus 15-18 units qhs, and Humalog 1-10 units TID. Glucose stable since admission. - Hold home medications  - Lantus 10 units qhs - Moderate SSI - CBGs - A1C pending  Seizure Disorder: Patient states that a couple weeks ago his antiepileptic was changed to Lamictal. His last seizure was a couple months ago. Had 2 seizures overnight. Was given Ativan after 2nd seizure.  - Continue Lamictal 200 mg daily - Lamotrigine level - Ativan 2 mg PRN for seizures lasting > 5 mins - Neurology on board  CAD: Pt reports having triple heart bypass years ago and an MI on 04/29/15. Is taking daily ASA 325 mg. Denies any cardiac symptoms at this time.  - Continue home ASA  - Telemetry as above  FEN/GI: SLIV, heart healthy carb modified diet Prophylaxis: Lovenox  Disposition: Home pending stroke and seizure work up  Subjective:  - Had 2 seizures overnight: 1 seizure around 7 pm that lasted ~10 seconds and 2nd seizure that lasted ~4AM. Was given Ativan after the 2nd episode - This morning patient is alert and sitting up in chair eating breakfast - Continues to have the same neurological deficits as yesterday - Complaints of a headache today that resolved with Tylenol  Objective: Temp:  [97.7 F (36.5 C)-98.4 F (36.9 C)] 98.1 F (36.7 C) (02/10 0631) Pulse Rate:  [51-76] 61 (02/10 0631) Resp:  [15-23] 20 (02/10 0631) BP: (130-229)/(89-113) 169/97 mmHg (02/10 0631)  SpO2:  [96 %-100 %] 99 % (02/10 0631) Weight:  [244 lb (110.678 kg)] 244 lb (110.678 kg) (02/09 1013) Physical Exam: General: In NAD, sitting up in chair eating breakfast Cardiovascular: regular rate and rhythm, normal s1 and s2, no murmurs Respiratory: normal work of breathing, clear to auscultation bilaterally Abdomen: soft, non distended, non tender, no  masses, normal bowel sounds Neuro: Alert and oriented x 3, CN 2-12 intact, 4/5 strength in bilateral left UE and LE. 5/5 strength in right UE and LE. Sensation intact throughout. Pronator drift of left arm Psych: normal mood and affect  Laboratory:  Recent Labs Lab 05/11/15 1135 05/11/15 1137 05/12/15 0320  WBC 7.5  --  8.2  HGB 12.1* 12.9* 10.5*  HCT 36.6* 38.0* 33.3*  PLT 325  --  316    Recent Labs Lab 05/11/15 1135 05/11/15 1137 05/12/15 0320  NA 140 141 142  K 4.2 4.0 3.8  CL 109 106 112*  CO2 21*  --  22  BUN 20 22* 14  CREATININE 2.11* 1.90* 1.45*  CALCIUM 8.7*  --  8.0*  PROT 6.1*  --   --   BILITOT 0.2*  --   --   ALKPHOS 90  --   --   ALT 13*  --   --   AST 21  --   --   GLUCOSE 304* 302* 165*    Imaging/Diagnostic Tests: Mr Donald Marks Contrast  05/11/2015  CLINICAL DATA:  55 year old male with sudden onset left side weakness since last night. Symptom onset 2100 hours. Initial encounter. EXAM: MRI HEAD WITHOUT CONTRAST TECHNIQUE: Multiplanar, multiecho pulse sequences of the brain and surrounding structures were obtained without intravenous contrast. COMPARISON:  Head CT without contrast 07/10/2006. FINDINGS: 9 mm oval area of restricted diffusion in the posterior right corona radiata tracking to the putamen. No other convincing restricted diffusion. Subtle signal abnormality along the left ventral brainstem on series 3, image 18 does not look suspicious on thin coronal imaging (series 5, image 9). Major intracranial vascular flow voids are preserved. There are multifocal chronic deep gray matter and corona radiata lacunar infarcts superimposed on the acute finding, more so in the right hemisphere. The brainstem and cerebellum appear spared. No associated cerebral cortical encephalomalacia. Occasional chronic micro hemorrhages. No midline shift, mass effect, evidence of mass lesion, ventriculomegaly, extra-axial collection or acute intracranial hemorrhage.  Cervicomedullary junction and pituitary are within normal limits. Negative visualized cervical spine. Normal bone marrow signal. Visible internal auditory structures appear normal. Mastoids are clear. Trace mucosal thickening in the paranasal sinuses. Postoperative changes to the left globe. Otherwise negative orbit and scalp soft tissues. IMPRESSION: 1. Acute on chronic small vessel infarct affecting the right corona radiata and putamen. No associated mass effect or hemorrhage. 2. Numerous underlying chronic lacunar infarcts in the bilateral deep gray matter nuclei (MCA and PCA territories). Electronically Signed   By: Odessa Fleming M.D.   On: 05/11/2015 12:35     Beaulah Dinning, MD 05/12/2015, 7:20 AM PGY-1, [facility] FPTS Intern pager: [phone], text pages welcome

## 2015-05-12 NOTE — Progress Notes (Signed)
   05/12/15 1630  Clinical Encounter Type  Visited With Patient and family together;Patient;Family;Health care provider  Visit Type Follow-up;Psychological support  Referral From Patient  Spiritual Encounters  Spiritual Needs Emotional   Chaplain followed up with patient. Patient seems to be a little exhausted fighting through all of the medical treatments and the rehab (dating back to an earlier cardiac issue). Patient's brother also came in during the visit and chaplain facilitated a life review with patient and the brother. Chaplain offered support, and our services are available as needed.   Alda Ponder, Chaplain 05/12/2015 4:32 PM

## 2015-05-12 NOTE — Progress Notes (Signed)
*  PRELIMINARY RESULTS* Vascular Ultrasound Carotid Duplex (Doppler) has been completed.  Preliminary findings: Bilateral: No significant (1-39%) ICA stenosis. Right vertebral not visualized. Left vertebral patent with antegrade flow.   Transcranial Doppler has been completed.   Farrel Demark, RDMS, RVT  05/12/2015, 3:13 PM

## 2015-05-12 NOTE — Evaluation (Signed)
Physical Therapy Evaluation Patient Details Name: Donald Marks MRN: [id number] DOB: 04/08/1960 Today's Date: 05/12/2015   History of Present Illness  adm with Lt sided weakness; + Rt caudate and putamen infarct; +seizures while hospitalized PMHx-seizures (~1 per week); DM, HTN, MI    Clinical Impression  Pt admitted with above diagnosis. Patient motivated and will do well with further rehab. Patient uses the [place] for his medical care and unsure how this will effect his post-acute care. Pt currently with functional limitations due to the deficits listed below (see PT Problem List).  Pt will benefit from skilled PT to increase their independence and safety with mobility to allow discharge to the venue listed below.       Follow Up Recommendations CIR (unclear if has social support needed; also VA benefits)    Equipment Recommendations   (TBA)    Recommendations for Other Services Rehab consult;OT consult     Precautions / Restrictions Precautions Precautions: Fall      Mobility  Bed Mobility                  Transfers Overall transfer level: Needs assistance Equipment used: Rolling walker (2 wheeled);None Transfers: Sit to/from Stand Sit to Stand: Mod assist;Min assist         General transfer comment: with RW min assist, however after fatigued he misunderstood PTs directions and  stood without RW with LOB and mod assist to recover  Ambulation/Gait Ambulation/Gait assistance: Mod assist Ambulation Distance (Feet): 8 Feet Assistive device: Rolling walker (2 wheeled) Gait Pattern/deviations: Step-through pattern;Decreased stride length;Decreased dorsiflexion - left;Steppage   Gait velocity interpretation: Below normal speed for age/gender General Gait Details: initially steppage gait on Lt, however as he fatigued (~6 ft of walking) he began sliding LLE/foot forward, incr lean to his left (despite support of LUE on RW) with Lt knee partially buckling  Stairs             Wheelchair Mobility    Modified Rankin (Stroke Patients Only) Modified Rankin (Stroke Patients Only) Pre-Morbid Rankin Score: No symptoms Modified Rankin: Moderately severe disability     Balance Overall balance assessment: Needs assistance         Standing balance support: Bilateral upper extremity supported Standing balance-Leahy Scale: Poor Standing balance comment: left shift with RW                             Pertinent Vitals/Pain Pain Assessment: No/denies pain    Home Living Family/patient expects to be discharged to:: Inpatient rehab Living Arrangements: Alone Available Help at Discharge:  (unknown) Type of Home: House Home Access: Stairs to enter Entrance Stairs-Rails: Right;Left Entrance Stairs-Number of Steps: 3 Home Layout: Two level;Able to live on main level with bedroom/bathroom Home Equipment: Walker - 2 wheels (was his father's)      Prior Function Level of Independence: Independent               Hand Dominance   Dominant Hand: Left    Extremity/Trunk Assessment   Upper Extremity Assessment: Defer to OT evaluation (triceps at least 4/5; grip at least 3+)           Lower Extremity Assessment: LLE deficits/detail;RLE deficits/detail RLE Deficits / Details: AROM WFL; knee extension 4/5 (seated), DF 5/5 LLE Deficits / Details: AAROM WFL: knee extension 3-, ankle DF 3-  Cervical / Trunk Assessment: Other exceptions  Communication   Communication: No difficulties  Cognition Arousal/Alertness: Awake/alert  Behavior During Therapy: Flat affect Overall Cognitive Status: Within Functional Limits for tasks assessed                      General Comments General comments (skin integrity, edema, etc.): Session interrupted for transport to test    Exercises        Assessment/Plan    PT Assessment Patient needs continued PT services  PT Diagnosis Hemiplegia dominant side;Difficulty walking   PT Problem  List Decreased strength;Decreased activity tolerance;Decreased balance;Decreased mobility;Decreased knowledge of use of DME;Impaired sensation;Impaired tone  PT Treatment Interventions DME instruction;Gait training;Functional mobility training;Therapeutic activities;Balance training;Neuromuscular re-education;Patient/family education   PT Goals (Current goals can be found in the Care Plan section) Acute Rehab PT Goals Patient Stated Goal: return to his home PT Goal Formulation: Patient unable to participate in goal setting (leaving for testing) Time For Goal Achievement: 05/19/15 Potential to Achieve Goals: Good    Frequency Min 4X/week   Barriers to discharge Decreased caregiver support      Co-evaluation               End of Session Equipment Utilized During Treatment: Gait belt Activity Tolerance: Patient limited by fatigue Patient left: in bed;Other (comment) (with transporters) Nurse Communication: Mobility status         Time: 1610-9604 PT Time Calculation (min) (ACUTE ONLY): 25 min   Charges:   PT Evaluation $PT Eval Moderate Complexity: 1 Procedure PT Treatments $Gait Training: 8-22 mins   PT G Codes:        Donald Marks Jun 06, 2015, 3:23 PM Pager [phone]

## 2015-05-12 NOTE — Care Management Note (Signed)
Case Management Note  Patient Details  Name: Donald Marks MRN: [id number] Date of Birth: 10-10-1960  Subjective/Objective:                    Action/Plan: Patient was admitted with CVA. Lives at home alone. Will follow for discharge needs pending PT/OT evals and physician orders.  Expected Discharge Date:                  Expected Discharge Plan:     In-House Referral:     Discharge planning Services     Post Acute Care Choice:    Choice offered to:     DME Arranged:    DME Agency:     HH Arranged:    HH Agency:     Status of Service:  In process, will continue to follow  Medicare Important Message Given:    Date Medicare IM Given:    Medicare IM give by:    Date Additional Medicare IM Given:    Additional Medicare Important Message give by:     If discussed at Long Length of Stay Meetings, dates discussed:    Additional CommentsAnda Kraft, RN 05/12/2015, 11:04 AM [phone]

## 2015-05-12 NOTE — Progress Notes (Signed)
RN walked into the room and patient was seizing his upper body. From time of entry it lasted about 25 seconds. Patient was able to open eyes and nod appropriately afterwards. Patient did not bite his tongue or have an incontinent episode. Ativan was given and vitals were taken. BP was 205/94. Patient is not in any pain at this time and is AAOx4. MD paged and monitoring closely. Desjuan Stearns, [place] Scrape, RN

## 2015-05-12 NOTE — Progress Notes (Signed)
OT Cancellation Note  Patient Details Name: Donald Marks MRN: [id number] DOB: Jun 23, 1960   Cancelled Treatment:    Reason Eval/Treat Not Completed: Other (comment) (Pt currently speaking with MD). Will check back for OT eval as time allows.  Gaye Alken M.S., OTR/L Pager: [phone]  05/12/2015, 11:50 AM

## 2015-05-12 NOTE — Progress Notes (Signed)
Rehab Admissions Coordinator Note:  Patient was screened by Clois Dupes for appropriateness for an Inpatient Acute Rehab Consult per PT recommendation.  At this time, you can consider an inpt rehab consult for possible admission, but the [place] does not cover inpatient acute rehab services here at [facility]. He would be self pay. If pt would like to use his VA benefits for rehab, I would recommend SNF rehab coverage with the VA benefits. PLease advise.  Clois Dupes 05/12/2015, 3:36 PM  I can be reached at [phone].

## 2015-05-12 NOTE — Progress Notes (Signed)
   05/12/15 1400  Clinical Encounter Type  Visited With Patient;Health care provider  Visit Type Initial  Referral From Nurse   Chaplain began to visit with patient upon hearing of a request from patient's nurse. Shortly into the visit, physical therapy  came by. Patient requested that chaplain return after PT. Chaplain will seek to follow-up, and our support is available as needed.   Alda Ponder, Chaplain 05/12/2015 2:02 PM

## 2015-05-12 NOTE — Progress Notes (Signed)
PT Cancellation Note  Patient Details Name: Donald Marks MRN: [id number] DOB: 1960-06-28   Cancelled Treatment:    Reason Eval/Treat Not Completed: Patient at procedure or test/unavailable; at 11:40 am with Dr. Pearlean Brownie; currently having room cleaned. Will attempt to see later    [name] 05/12/2015, 1:51 PM  Pager [phone]

## 2015-05-13 DIAGNOSIS — E785 Hyperlipidemia, unspecified: Secondary | ICD-10-CM | POA: Insufficient documentation

## 2015-05-13 DIAGNOSIS — R748 Abnormal levels of other serum enzymes: Secondary | ICD-10-CM

## 2015-05-13 DIAGNOSIS — I63311 Cerebral infarction due to thrombosis of right middle cerebral artery: Secondary | ICD-10-CM

## 2015-05-13 DIAGNOSIS — R7989 Other specified abnormal findings of blood chemistry: Secondary | ICD-10-CM | POA: Insufficient documentation

## 2015-05-13 LAB — CBC
HCT: 36.7 % — ABNORMAL LOW (ref 39.0–52.0)
Hemoglobin: 11.6 g/dL — ABNORMAL LOW (ref 13.0–17.0)
MCH: 28 pg (ref 26.0–34.0)
MCHC: 31.6 g/dL (ref 30.0–36.0)
MCV: 88.6 fL (ref 78.0–100.0)
PLATELETS: 325 10*3/uL (ref 150–400)
RBC: 4.14 MIL/uL — AB (ref 4.22–5.81)
RDW: 13.5 % (ref 11.5–15.5)
WBC: 7.8 10*3/uL (ref 4.0–10.5)

## 2015-05-13 LAB — BASIC METABOLIC PANEL
Anion gap: 6 (ref 5–15)
BUN: 13 mg/dL (ref 6–20)
CALCIUM: 8.7 mg/dL — AB (ref 8.9–10.3)
CO2: 23 mmol/L (ref 22–32)
CREATININE: 1.49 mg/dL — AB (ref 0.61–1.24)
Chloride: 113 mmol/L — ABNORMAL HIGH (ref 101–111)
GFR calc non Af Amer: 52 mL/min — ABNORMAL LOW (ref >60)
GFR, EST AFRICAN AMERICAN: 60 mL/min — AB (ref >60)
Glucose, Bld: 116 mg/dL — ABNORMAL HIGH (ref 65–99)
Potassium: 4.2 mmol/L (ref 3.5–5.1)
SODIUM: 142 mmol/L (ref 135–145)

## 2015-05-13 LAB — HEMOGLOBIN A1C
Hgb A1c MFr Bld: 10.5 % — ABNORMAL HIGH (ref 4.8–5.6)
MEAN PLASMA GLUCOSE: 255 mg/dL

## 2015-05-13 LAB — GLUCOSE, CAPILLARY
GLUCOSE-CAPILLARY: 122 mg/dL — AB (ref 65–99)
GLUCOSE-CAPILLARY: 108 mg/dL — AB (ref 65–99)
GLUCOSE-CAPILLARY: 163 mg/dL — AB (ref 65–99)
Glucose-Capillary: 150 mg/dL — ABNORMAL HIGH (ref 65–99)
Glucose-Capillary: 135 mg/dL — ABNORMAL HIGH (ref 65–99)

## 2015-05-13 MED ORDER — NIFEDIPINE ER 60 MG PO TB24
120.0000 mg | ORAL_TABLET | Freq: Every day | ORAL | Status: DC
  Administered 2015-05-13 – 2015-05-16 (×4): 120 mg via ORAL
  Filled 2015-05-13 (×4): qty 2

## 2015-05-13 MED ORDER — HYDRALAZINE HCL 20 MG/ML IJ SOLN
10.0000 mg | Freq: Four times a day (QID) | INTRAMUSCULAR | Status: DC | PRN
  Administered 2015-05-13 – 2015-05-14 (×2): 10 mg via INTRAVENOUS
  Filled 2015-05-13 (×2): qty 1

## 2015-05-13 MED ORDER — SODIUM CHLORIDE 0.9 % IV SOLN
INTRAVENOUS | Status: AC
  Administered 2015-05-13: 17:00:00 via INTRAVENOUS
  Filled 2015-05-13: qty 1000

## 2015-05-13 MED ORDER — FUROSEMIDE 40 MG PO TABS
40.0000 mg | ORAL_TABLET | Freq: Every day | ORAL | Status: DC
  Administered 2015-05-13 – 2015-05-16 (×4): 40 mg via ORAL
  Filled 2015-05-13 (×4): qty 1

## 2015-05-13 MED ORDER — CLOPIDOGREL BISULFATE 75 MG PO TABS
75.0000 mg | ORAL_TABLET | Freq: Every day | ORAL | Status: DC
  Administered 2015-05-13 – 2015-05-16 (×4): 75 mg via ORAL
  Filled 2015-05-13 (×4): qty 1

## 2015-05-13 NOTE — Progress Notes (Signed)
Pt requesting a nurse, this rn went in to check on pt. asked pt if there was anything he needed, pt stated "yea I want her out". Rn not sure what patient meant. Pt stated "she was going to tell on me for hoarding". Rn asked what pt was hoarding. Visitor asked" if pt was allowed to hoard food?" Rn asked what kind of food pt was hoarding. Visitor showed rn a dish of mashed potatoes pt had in drawer. Pt staid the "potatoes were from last night, and if he got hungry he was going to ask someone to heat them up". Rn explained that in general that food did not need to sit out for over an hour before being placed a refrigerator. Rn told pt that if he got hungry staff could give him a snack.   Visitor asking if pts doctor was going to come by again today and that she had questions about his plan of care. Rn said she would pass the message along to his nurse.

## 2015-05-13 NOTE — Progress Notes (Signed)
Family Medicine Teaching Service Daily Progress Note Intern Pager: [phone]  Patient name: Donald Marks Medical record number: [id number] Date of birth: Nov 12, 1960 Age: 55 y.o. Gender: male  Primary Care Provider: Sondra Come, MD Consultants: Neurology  Code Status: Full   Assessment and Plan: 55 y.o. male presenting with left hemiparesis, found to have CVA. PMH is significant for diabetes, hypertension, CAD, recent MI with stent placement, seizures and ?Pancreatic cancer.   # Acute CVA: L hemiparesis since night of 2/8. MRI with acute CVA of right putamen and caudate. Passed swallow screen. EKG showing LVH and possible Q wave in lead 3. Echo from 1/28 showing EF 55-60% with concentric LVH, no valvular dysfunction.  - Telemetry - Neuro checks q2 - Carotid U/S pending  - Echocardiogram - c/s neuro stroke team, appreciate recs - c/s SLP, PT, OT - Note that [facility] will not [facility] inpatient rehab. Consider discharge to SNF with rehab coverage that accepts [facility]. Will consult SW. - Transition from Aspirin to Plavix  # AKI: Cr 2.11 at admission, baseline 0.8 in 2008 however no data after that time until current hospitalization - AM BMP pending - IVF discontinued  # Hypertension: Takes Nifedipine 120 mg daily, Lasix 80 mg daily, KDUR 20 meq BID. Initially held for permissive hypertension in setting of acute CVA.  - Monitor BP. Currently elevated at   - Restart Nifedipine and Furosemide.  - Hydralazine PRN for BP >180/110  # Diabetes Mellitus: Pt unsure of what medications he takes at home. Per Epic he takes; Novolog 60 units TID, Lantus 15-18 units qhs, and Humalog 1-10 units TID. A1C 10.5. Glucose stable since admission. - Hold home medications  - Lantus 10 units qhs - Moderate SSI, HS - CBGs  # Seizure Disorder: Antiepileptic was changed to Lamictal a few weeks ago.  - Lamictal increased to 300 mg daily - Ativan 2 mg PRN for seizures lasting > 5 mins - Neurology consulted.  FEN/GI:  SLIV, heart healthy carb modified diet Prophylaxis: Lovenox  Disposition: Home pending stroke and seizure work up  Subjective:  Notes mild improvement in movement of left leg. Agreeable to SNF with rehab at this time. One seizure this morning, resolved. No further concerns.  Objective: Temp:  [97.5 F (36.4 C)-98.3 F (36.8 C)] 98 F (36.7 C) (02/11 0515) Pulse Rate:  [56-64] 64 (02/11 0515) Resp:  [17-20] 20 (02/11 0515) BP: (189-226)/(73-96) 218/95 mmHg (02/11 0515) SpO2:  [99 %-100 %] 100 % (02/11 0515) Physical Exam: General: 55yo male resting comfortably in no apparent distress Cardiovascular: regular rate and rhythm, normal s1 and s2, no murmurs Respiratory: normal work of breathing, clear to auscultation bilaterally Abdomen: soft, non distended, non tender, no masses, normal bowel sounds Neuro: Alert and oriented x 3, CN 2-12 intact, 4/5 strength in bilateral left UE and LE improved slightly from last exam. 5/5 strength in right UE and LE. Sensation intact throughout. Pronator drift of left arm Psych: normal mood and affect  Laboratory:  Recent Labs Lab 05/11/15 1135 05/11/15 1137 05/12/15 0320  WBC 7.5  --  8.2  HGB 12.1* 12.9* 10.5*  HCT 36.6* 38.0* 33.3*  PLT 325  --  316    Recent Labs Lab 05/11/15 1135 05/11/15 1137 05/12/15 0320  NA 140 141 142  K 4.2 4.0 3.8  CL 109 106 112*  CO2 21*  --  22  BUN 20 22* 14  CREATININE 2.11* 1.90* 1.45*  CALCIUM 8.7*  --  8.0*  PROT 6.1*  --   --  BILITOT 0.2*  --   --   ALKPHOS 90  --   --   ALT 13*  --   --   AST 21  --   --   GLUCOSE 304* 302* [address] [place], DO 05/13/2015, 9:02 AM PGY-2, [facility] FPTS Intern pager: [phone], text pages welcome

## 2015-05-13 NOTE — Progress Notes (Signed)
STROKE TEAM PROGRESS NOTE   SUBJECTIVE (INTERVAL HISTORY) No family is at the bedside.  Overall he feels his condition is stable. Patient reports a life long hx of mostly noctunal seizures. Service dog has been recommended from [place] because he lives alone, and [place] is working on it. [facility]. BP still high, will resume home BP meds.    OBJECTIVE Temp:  [97.6 F (36.4 C)-98.9 F (37.2 C)] 98.9 F (37.2 C) (02/11 0917) Pulse Rate:  [56-71] 71 (02/11 0917) Cardiac Rhythm:  [-] Normal sinus rhythm (02/11 0917) Resp:  [17-20] 18 (02/11 0917) BP: (177-226)/(73-103) 177/103 mmHg (02/11 0917) SpO2:  [98 %-100 %] 98 % (02/11 0917)  CBC:   Recent Labs Lab 05/11/15 1135  05/12/15 0320 05/13/15 1038  WBC 7.5  --  8.2 7.8  NEUTROABS 4.0  --   --   --   HGB 12.1*  < > 10.5* 11.6*  HCT 36.6*  < > 33.3* 36.7*  MCV 88.0  --  87.6 88.6  PLT 325  --  316 325  < > = values in this interval not displayed.  Basic Metabolic Panel:   Recent Labs Lab 05/12/15 0320 05/13/15 1038  NA 142 142  K 3.8 4.2  CL 112* 113*  CO2 22 23  GLUCOSE 165* 116*  BUN 14 13  CREATININE 1.45* 1.49*  CALCIUM 8.0* 8.7*    Lipid Panel:     Component Value Date/Time   CHOL 204* 05/12/2015 0320   TRIG 139 05/12/2015 0320   HDL 35* 05/12/2015 0320   CHOLHDL 5.8 05/12/2015 0320   VLDL 28 05/12/2015 0320   LDLCALC 141* 05/12/2015 0320   HgbA1c:  Lab Results  Component Value Date   HGBA1C 10.5* 05/12/2015   Urine Drug Screen:     Component Value Date/Time   LABOPIA NONE DETECTED 05/11/2015 1339   COCAINSCRNUR NONE DETECTED 05/11/2015 1339   LABBENZ NONE DETECTED 05/11/2015 1339   AMPHETMU NONE DETECTED 05/11/2015 1339   THCU NONE DETECTED 05/11/2015 1339   LABBARB NONE DETECTED 05/11/2015 1339      IMAGING I have personally reviewed the radiological images below and agree with the radiology interpretations.  Mr Brain Wo Contrast  05/11/2015  IMPRESSION: 1. Acute on chronic  small vessel infarct affecting the right corona radiata and putamen. No associated mass effect or hemorrhage. 2. Numerous underlying chronic lacunar infarcts in the bilateral deep gray matter nuclei (MCA and PCA territories).    CUS - Bilateral: No significant (1-39%) ICA stenosis. Right vertebral not visualized. Left vertebral patent with antegrade flow.  TCD pending  TTE - pending   PHYSICAL EXAM Well built middle-aged African-American male currently not in distress. . Afebrile. Head is nontraumatic. Neck is supple without bruit.    Cardiac exam no murmur or gallop. Lungs are clear to auscultation. Distal pulses are well felt. Neurological Exam :  Awake alert oriented x 3 normal speech and language. Mild left lower face asymmetry. Tongue midline. No drift. Mild diminished fine finger movements on left. Orbits right over left upper extremity. Mild left grip weak.. Normal sensation . Normal coordination.   ASSESSMENT/PLAN [name] is a 55 y.o. male with history of hypertension, diabetes, CAD, recent MI with stent placement presenting with left henmiparesis and falls (6-7x during the night). He did not receive IV t-PA due to delay in arrival.   Stroke:  R corona radiata and putamen acute on chronic infarct secondary to small vessel disease  source  MRI  R corona radiata and putamen acute on chronic infarct  TCD pending  Carotid Doppler  ICAs no stenosis, but right [facility] not visualized - asymptomatic  2D Echo  pending  LDL 141  HgbA1c 10.5  Lovenox 40 mg sq daily for VTE prophylaxis Diet Carb Modified Fluid consistency:: Thin; Room service appropriate?: Yes  aspirin 81 mg daily prior to admission, now on aspirin 325 mg daily. Recommend to change to plavix  daily for stroke prevention.  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  on lasix, nifedipine  Hyperlipidemia  Home meds:  lipitor 80, resumed in hospital  LDL 141, goal < 70  Continue statin at discharge  Diabetes, uncontrolled  HgbA1c 10.5, goal < 7.0  uncontrolled  On lantus  SSI  Follow [facility] with [facility] closely  Other Stroke Risk Factors  Obesity, Body mass index is 34.05 kg/(m^2).   Coronary artery disease s/p CABG, MI  Other Active Problems  Chest pain   AKI, Cr 2.11 -> 1.9 -> 1.49  Hospital day # 2  Neurology will sign off. Please call with questions. Pt will follow up with [facility] in about one month. Thanks for the consult.  Marvel Plan, MD PhD Stroke Neurology 05/13/2015 1:38 PM   To contact Stroke Continuity provider, please refer to [email or web address]. After hours, contact General Neurology Has some months reported healthy

## 2015-05-14 ENCOUNTER — Inpatient Hospital Stay (HOSPITAL_COMMUNITY): Payer: Non-veteran care

## 2015-05-14 DIAGNOSIS — I639 Cerebral infarction, unspecified: Secondary | ICD-10-CM

## 2015-05-14 LAB — BASIC METABOLIC PANEL
Anion gap: 10 (ref 5–15)
BUN: 11 mg/dL (ref 6–20)
CHLORIDE: 109 mmol/L (ref 101–111)
CO2: 22 mmol/L (ref 22–32)
Calcium: 8.9 mg/dL (ref 8.9–10.3)
Creatinine, Ser: 1.49 mg/dL — ABNORMAL HIGH (ref 0.61–1.24)
GFR calc Af Amer: 60 mL/min — ABNORMAL LOW (ref >60)
GFR, EST NON AFRICAN AMERICAN: 52 mL/min — AB (ref >60)
GLUCOSE: 119 mg/dL — AB (ref 65–99)
POTASSIUM: 3.6 mmol/L (ref 3.5–5.1)
Sodium: 141 mmol/L (ref 135–145)

## 2015-05-14 LAB — GLUCOSE, CAPILLARY
GLUCOSE-CAPILLARY: 141 mg/dL — AB (ref 65–99)
GLUCOSE-CAPILLARY: 136 mg/dL — AB (ref 65–99)
GLUCOSE-CAPILLARY: 161 mg/dL — AB (ref 65–99)
GLUCOSE-CAPILLARY: 120 mg/dL — AB (ref 65–99)
Glucose-Capillary: 115 mg/dL — ABNORMAL HIGH (ref 65–99)

## 2015-05-14 MED ORDER — HYDRALAZINE HCL 20 MG/ML IJ SOLN: 20.0000 mg | Freq: Four times a day (QID) | INTRAMUSCULAR | Status: DC | PRN

## 2015-05-14 NOTE — Discharge Summary (Signed)
Family Medicine Teaching [facility] Discharge Summary  Patient name: Donald Marks Medical record number: [id number] Date of birth: [birth year] Age: 55 y.o. Gender: male Date of Admission: 05/11/2015  Date of Discharge: 05/16/2015 Admitting Physician: Doreene Eland, MD  Primary Care Provider: Sondra Come, MD Consultants: SLP, PT, OT, neurology, and cardiology   Indication for Hospitalization: CVA  Discharge Diagnoses/Problem List:  Patient Active Problem List   Diagnosis Date Noted  . Elevated serum creatinine   . HLD (hyperlipidemia)   . Left hemiplegia (HCC) 05/12/2015  . Acute CVA (cerebrovascular accident) (HCC) 05/11/2015  . Stroke (cerebrum) (HCC) 05/11/2015  . CVA (cerebral infarction) 05/11/2015  . Hyperglycemia   . AKI (acute kidney injury) (HCC)   . Seizures (HCC)   . Uncontrolled type 2 diabetes mellitus with complication, with long-term current use of insulin (HCC)   . Essential hypertension    Disposition: SNF  Discharge Condition: stable  Discharge Exam:  General: In NAD, sitting up in bed eating breakfast Cardiovascular: regular rate and rhythm, normal s1 and s2, no murmurs Respiratory: normal work of breathing, clear to auscultation bilaterally Abdomen: soft, non distended, non tender, no masses, normal bowel sounds Neuro: Alert and oriented x 3, CN 2-12 intact, 4/5 strength in bilateral left UE and LE improved slightly from last exam. 5/5 strength in right UE and LE. Sensation intact throughout. Pronator drift on the left  MSK: right knee swelling in lateral aspect with tenderness to deep palpation.  Psych: normal mood and affect  Brief Hospital Course:  55 y.o. male who presented with left hemiparesis, found to have CVA. PMH is significant for diabetes, hypertension, CAD, recent MI with stent placement, seizures.  Acute CVA: Presented with left hemiparesis since night of 2/8. MRI performed in ED revealed acute CVA of right putamen and caudate.  Neurology consulted. Patient transitioned from home ASA to Plavix. EKG showed LVH and possible Q wave in lead 3. Pt admitted to telemetry.  Neuro checks were perfomed q2 for 12 hours and then q4. Echocardiogram; EF 60% - 65% with G2DD with severe left atrial dilatation. Carotid U/S; No significant (1-39%) ICA stenosis. Cardiology consulted for abnormal left atrial dilation on echocardiogram, there were no further recommendations. SLP, PT, OT were consulted. Pt passed swallow screen PT/OT recommended SNF placement. Patient had a fall in the shower day prior to discharge; he was sitting in a shower chair and bent over to get soap and hit his lip and knees. Shower was supervised by RN. "RICE" therapy was applied.  Upon discharge, patient's left hemiparesis improved but did not resolve.  Seizure Disorder:  Neurology consulted above. Had approximately 4 episodes of witnessed seizures lasting <1 minute each. Lamictal increased to 300 mg daily.   Diabetes Mellitus: Given Lantus 10 units qhs with moderate sliding scale insulin and night time coverage while hospitalized. Capillary blood glucose was monitored and remained in stable range.   All other chronic medical conditions stable throughout admission and managed with home regimens.  Issues for Follow Up:  1. Will need [facility] follow up for seizures and CVA in about 1 month 2. Ensure knee pain resolves from fall in shower 3. Will be sending to SNF 4. Upon discharge, patient was hypertensive. May need to adjust antihypertensive medications if it remains elevated  Significant Procedures: none  Significant Labs and Imaging:   Recent Labs Lab 05/11/15 1135 05/11/15 1137 05/12/15 0320 05/13/15 1038  WBC 7.5  --  8.2 7.8  HGB 12.1* 12.9* 10.5* 11.6*  HCT 36.6* 38.0* 33.3* 36.7*  PLT 325  --  316 325    Recent Labs Lab 05/11/15 1135 05/11/15 1137 05/12/15 0320 05/13/15 1038 05/14/15 0650  NA 140 141 142 142 141  K 4.2 4.0 3.8 4.2 3.6   CL 109 106 112* 113* 109  CO2 21*  --  GLUCOSE 304* 302* 165* 116* 119*  BUN 20 22* CREATININE 2.11* 1.90* 1.45* 1.49* 1.49*  CALCIUM 8.7*  --  8.0* 8.7* 8.9  ALKPHOS 90  --   --   --   --   AST 21  --   --   --   --   ALT 13*  --   --   --   --   ALBUMIN 2.9*  --   --   --   --     No results found.   Results/Tests Pending at Time of Discharge: None  Discharge Medications:    Medication List    STOP taking these medications        aspirin 325 MG tablet     insulin aspart 100 UNIT/ML injection  Commonly known as:  novoLOG      TAKE these medications        atorvastatin 80 MG tablet  Commonly known as:  LIPITOR  Take 80 mg by mouth daily.     clopidogrel 75 MG tablet  Commonly known as:  PLAVIX  Take 1 tablet (75 mg total) by mouth daily.     furosemide 80 MG tablet  Commonly known as:  LASIX  Take 40 mg by mouth daily.     insulin glargine 100 UNIT/ML injection  Commonly known as:  LANTUS  Inject 15-18 Units into the skin at bedtime. Patient uses sliding scale     insulin lispro 100 UNIT/ML injection  Commonly known as:  HUMALOG  Inject 0-10 Units into the skin 3 (three) times daily before meals.     lamoTRIgine 150 MG tablet  Commonly known as:  LAMICTAL  Take 2 tablets (300 mg total) by mouth daily.     magnesium oxide 400 MG tablet  Commonly known as:  MAG-OX  Take 400 mg by mouth daily.     NIFEdipine 60 MG 24 hr tablet  Commonly known as:  PROCARDIA-XL/ADALAT CC  Take 120 mg by mouth daily.     potassium chloride SA 20 MEQ tablet  Commonly known as:  K-DUR,KLOR-CON  Take 20 mEq by mouth 2 (two) times daily.     Vitamin D3 2000 units capsule  Take 2,000 Units by mouth daily.        Discharge Instructions: Please refer to Patient Instructions section of EMR for full details.  Patient was counseled important signs and symptoms that should prompt return to medical care, changes in medications, dietary instructions,  activity restrictions, and follow up appointments.   Follow-Up Appointments: Follow-up Information    Schedule an appointment as soon as possible for a visit with [facility], MD.   Specialty:  Internal Medicine   Why:  Derek [place] states team nurse will call patient with  appt info between 8am -4pm  05/17/15   Contact information:   [address] [place] [address]       Follow-up Information    Schedule an appointment as soon as possible for a visit with [facility], MD.   Specialty:  Internal Medicine   Why:  Derek [place] states team nurse will call  patient with  appt info between 8am -4pm  05/17/15   Contact information:   [address] [place] [address]       Marquette Saa, MD 05/16/2015, 2:58 PM PGY-1, [facility] Family [facility]

## 2015-05-14 NOTE — Progress Notes (Signed)
Occupational Therapy Evaluation Patient Details Name: Donald Marks MRN: [id number] DOB: [birth year] Today's Date: 05/14/2015    History of Present Illness adm with Lt sided weakness; + Rt caudate and putamen infarct; +seizures while hospitalized PMHx-seizures (~1 per week); DM, HTN, MI   Clinical Impression   Pt admitted with the above diagnoses and presents with below problem list. Pt will benefit from continued acute OT to address the below listed deficits and maximize independence with BADLs prior to d/c to venue below. PTA pt was independent with ADLs. Pt is currently min to mod A with ADLs, +2 for ambulation. Feel pt would be a great candidate for CIR. Will need continued rehab in skilled care setting. OT to continue to follow acutely.      Follow Up Recommendations  SNF;CIR;Other (comment)    Equipment Recommendations  Other (comment) (TBD next venue)    Recommendations for Other Services       Precautions / Restrictions Precautions Precautions: Fall Restrictions Weight Bearing Restrictions: No      Mobility Bed Mobility Overal bed mobility: Needs Assistance Bed Mobility: Supine to Sit     Supine to sit: Mod assist;HOB elevated     General bed mobility comments: assist to power up trunk. used bed rails and momentum.  Transfers Overall transfer level: Needs assistance Equipment used: Rolling walker (2 wheeled) Transfers: Sit to/from [employer] Sit to Stand: Mod assist;Min assist Stand pivot transfers: Mod assist;From elevated surface       General transfer comment: cues for hand placement after educating about hand placement. Cues for position of rw during transfer,    Balance Overall balance assessment: Needs assistance Sitting-balance support: Feet supported;Single extremity supported Sitting balance-Leahy Scale: Fair Sitting balance - Comments: sat EOB about 10 minutes   Standing balance support: Bilateral upper extremity  supported Standing balance-Leahy Scale: Poor Standing balance comment: rw and assist for balance                            ADL Overall ADL's : Needs assistance/impaired Eating/Feeding: Sitting;Set up Eating/Feeding Details (indicate cue type and reason): needing to use non dominant right hand at times Grooming: Set up Grooming Details (indicate cue type and reason): needing to use non dominant right hand at times Upper Body Bathing: Minimal assitance;Sitting   Lower Body Bathing: Moderate assistance;Sit to/from stand   Upper Body Dressing : Minimal assistance;Sitting   Lower Body Dressing: Moderate assistance;Sit to/from stand   Toilet Transfer: Moderate assistance;Stand-pivot;BSC;RW Toilet Transfer Details (indicate cue type and reason): simulated with EOB>recliner Toileting- Clothing Manipulation and Hygiene: Moderate assistance;Sit to/from stand       Functional mobility during ADLs:  (+2) General ADL Comments: Pt completed bed mobility and SPT to recliner with min to mod A. Cues needed for techniques. Pt with some mild left neglect, reports left eye has been weaker PTA. Issued built-up foam for use with utensils and tools to facilitate grip during ADL tasks.      Vision Vision Assessment?: Yes Eye Alignment: Within Functional Limits Ocular Range of Motion: Within Functional Limits Alignment/Gaze Preference: Gaze right Tracking/Visual Pursuits: Able to track stimulus in all quads without difficulty Visual Fields: No apparent deficits Additional Comments: Pt reports baseline weakness in left eye, "that's why I wear glasses."   Perception     Praxis      Pertinent Vitals/Pain Pain Assessment: Faces Faces Pain Scale: Hurts even more Pain Location: L anterior shoulder with shoulder flexion >  90 degrees Pain Descriptors / Indicators: Grimacing Pain Intervention(s): Monitored during session;Limited activity within patient's tolerance     Hand Dominance  Left   Extremity/Trunk Assessment Upper Extremity Assessment Upper Extremity Assessment: LUE deficits/detail LUE Deficits / Details: triceps at least 4/5; grip at least 3+; fine motor deficits, reports difficulty holding fork, toothbrush; sensation grossly intact LUE Coordination: decreased fine motor;decreased gross motor   Lower Extremity Assessment Lower Extremity Assessment: Defer to PT evaluation       Communication Communication Communication: No difficulties   Cognition Arousal/Alertness: Awake/alert Behavior During Therapy: Flat affect Overall Cognitive Status: Within Functional Limits for tasks assessed                     General Comments       Exercises       Shoulder Instructions      Home Living Family/patient expects to be discharged to:: Inpatient rehab Living Arrangements: Alone   Type of Home: House Home Access: Stairs to enter Entrance Stairs-Number of Steps: 3 Entrance Stairs-Rails: Right;Left Home Layout: Two level;Able to live on main level with bedroom/bathroom               Home Equipment: Dan Humphreys - 2 wheels      Lives With: Alone    Prior Functioning/Environment Level of Independence: Independent        Comments: works in [employer]    OT Diagnosis: Disturbance of vision;Hemiplegia dominant side   OT Problem List: Impaired balance (sitting and/or standing);Decreased knowledge of use of DME or AE;Decreased knowledge of precautions;Impaired UE functional use;Impaired tone;Impaired vision/perception;Decreased coordination   OT Treatment/Interventions: Self-care/ADL training;Therapeutic exercise;DME and/or AE instruction;Therapeutic activities;Patient/family education;Balance training;Visual/perceptual remediation/compensation    OT Goals(Current goals can be found in the care plan section) Acute Rehab OT Goals Patient Stated Goal: return to his home OT Goal Formulation: With patient Time For Goal Achievement:  05/28/15 Potential to Achieve Goals: Good ADL Goals Pt Will Perform Eating: with modified independence;with adaptive utensils;sitting Pt Will Perform Grooming: with modified independence;with adaptive equipment;sitting Pt Will Perform Upper Body Bathing: sitting;with min guard assist Pt Will Perform Lower Body Bathing: with min assist;sit to/from stand;with adaptive equipment Pt Will Perform Upper Body Dressing: with min guard assist;with adaptive equipment;sitting Pt Will Perform Lower Body Dressing: with min assist;with adaptive equipment;sit to/from stand Pt Will Transfer to Toilet: with min assist;ambulating;bedside commode Pt/caregiver will Perform Home Exercise Program: Increased strength;Left upper extremity;With written HEP provided Additional ADL Goal #1: Pt will complete bed mobility at mod I level to prepare for OOB ADLs.   OT Frequency: Min 2X/week   Barriers to D/C: Decreased caregiver support          Co-evaluation              End of Session Equipment Utilized During Treatment: Gait belt;Rolling walker  Activity Tolerance: Patient tolerated treatment well Patient left: in chair;with call bell/phone within reach;with chair alarm set   Time: 1001-1035 OT Time Calculation (min): 34 min Charges:  OT General Charges $OT Visit: 1 Procedure OT Evaluation $OT Eval Low Complexity: 1 Procedure OT Treatments $Self Care/Home Management : 8-22 mins G-Codes:    Pilar Grammes 09-Jun-2015, 10:57 AM

## 2015-05-14 NOTE — Progress Notes (Signed)
Family Medicine Teaching Service Daily Progress Note Intern Pager: [phone]  Patient name: Donald Marks Medical record number: [id number] Date of birth: 12/19/1960 Age: 55 y.o. Gender: male  Primary Care Provider: Sondra Come, MD Consultants: Neurology  Code Status: Full   Assessment and Plan: 55 y.o. male presenting with left hemiparesis, found to have CVA. PMH is significant for diabetes, hypertension, CAD, recent MI with stent placement, seizures and ?Pancreatic cancer.   Acute CVA: L hemiparesis since night of 2/8. MRI with acute CVA of right putamen and caudate. Passed swallow screen. EKG showing LVH and possible Q wave in lead 3. Echo from 1/28 showing EF 55-60% with concentric LVH, no valvular dysfunction. 2/12 Echocardiogram; EF 60% - 65% with G2DD with severe left atrial dilatation. Carotid U/S; No significant (1-39%) ICA stenosis.  - Telemetry - Neuro checks q2 - c/s neuro stroke team, appreciate recs; have signed off - c/s SLP, PT, OT - Continue Plavix - Note that [facility] will not [facility] inpatient [facility]hab. Consider discharge to SNF with rehab coverage that accepts [facility]. Will consult SW. - Consult Cardiology for abnormal left atrial dilation on Echocardiogram, may need loop recorder to rule out a-fib  AKI:  Cr 1.49 <<- from Cr 2.11 at admission, baseline 0.8 in 2008 however no data after that time until current hospitalization - follow BMP - IVF discontinued  Hypertension: Takes Nifedipine 120 mg daily, Lasix 80 mg daily, KDUR 20 meq BID. Initially held for permissive hypertension in setting of acute CVA.  - Monitor BP.  - Continue Nifedipine and Furosemide - Hydralazine PRN for BP >180/110  Diabetes Mellitus: Pt unsure of what medications he takes at home. Per Epic he takes; Novolog 60 units TID, Lantus 15-18 units qhs, and Humalog 1-10 units TID. A1C 10.5. Glucose stable since admission. - Hold home medications  - Lantus 10 units qhs - Moderate SSI, HS - CBGs  Seizure  Disorder: Antiepileptic was changed to Lamictal a few weeks ago.  - Lamictal increased to 300 mg daily since 2/11 - Ativan 2 mg PRN for seizures lasting > 5 mins - Neurology consulted.  FEN/GI: SLIV, heart healthy carb modified diet Prophylaxis: Lovenox  Disposition: SNF pending bed assignment  Subjective:  - No acute events overnight - Denies any chest pain, shortness of breath, or palpitations - Is looking forward to speaking with the social worker about placement - Tolerating normal diet, last BM was 2 days ago  Objective: Temp:  [97.9 F (36.6 C)-98.4 F (36.9 C)] 98.4 F (36.9 C) (02/13 0140) Pulse Rate:  [69-82] 76 (02/13 0140) Resp:  [18-20] 20 (02/13 0140) BP: (138-147)/(74-125) 141/74 mmHg (02/13 0140) SpO2:  [97 %-100 %] 99 % (02/13 0140) Physical Exam: General: In NAD, sitting up in bed talking on the phone Cardiovascular: regular rate and rhythm, normal s1 and s2, no murmurs Respiratory: normal work of breathing, clear to auscultation bilaterally Abdomen: soft, non distended, non tender, no masses, normal bowel sounds Neuro: Alert and oriented x 3, CN 2-12 intact, 4/5 strength in bilateral left UE and LE improved slightly from last exam. 5/5 strength in right UE and LE. Sensation intact throughout.  Psych: normal mood and affect  Laboratory:  Recent Labs Lab 05/11/15 1135 05/11/15 1137 05/12/15 0320 05/13/15 1038  WBC 7.5  --  8.2 7.8  HGB 12.1* 12.9* 10.5* 11.6*  HCT 36.6* 38.0* 33.3* 36.7*  PLT 325  --  316 325    Recent Labs Lab 05/11/15 1135  05/12/15 0320 05/13/15 1038 05/14/15 0650  NA 140  < > 142 142 141  K 4.2  < > 3.8 4.2 3.6  CL 109  < > 112* 113* 109  CO2 21*  --  BUN 20  < > CREATININE 2.11*  < > 1.45* 1.49* 1.49*  CALCIUM 8.7*  --  8.0* 8.7* 8.9  PROT 6.1*  --   --   --   --   BILITOT 0.2*  --   --   --   --   ALKPHOS 90  --   --   --   --   ALT 13*  --   --   --   --   AST 21  --   --   --   --   GLUCOSE  304*  < > 165* 116* 119*  < > = values in this interval not displayed.  Beaulah Dinning, MD 05/14/2015, 10:39 PM PGY-1, [facility] FPTS Intern pager: [phone], text pages welcome

## 2015-05-14 NOTE — Progress Notes (Signed)
RN called to patient's room. When Rn entered room, pt noted to be having a seizure with twitching of head and bilateral arms.  Seizure lasted around 1-2 minutes while RN in the room. Ativan not given due to seizure being less than 5 mins. Md notified.   No new orders given. Pt now post-ictal but responds to commands. RN will continue to monitor.

## 2015-05-14 NOTE — Progress Notes (Signed)
  Echocardiogram 2D Echocardiogram has been performed.  Arvil Chaco 05/14/2015, 3:34 PM

## 2015-05-14 NOTE — Progress Notes (Signed)
Family Medicine Teaching Service Daily Progress Note Intern Pager: [phone]  Patient name: Donald Marks Medical record number: [id number] Date of birth: 25-Apr-1960 Age: 55 y.o. Gender: male  Primary Care Provider: Sondra Come, MD Consultants: Neurology  Code Status: Full   Assessment and Plan: 55 y.o. male presenting with left hemiparesis, found to have CVA. PMH is significant for diabetes, hypertension, CAD, recent MI with stent placement, seizures and ?Pancreatic cancer.   # Acute CVA: L hemiparesis since night of 2/8. MRI with acute CVA of right putamen and caudate. Passed swallow screen. EKG showing LVH and possible Q wave in lead 3. Echo from 1/28 showing EF 55-60% with concentric LVH, no valvular dysfunction.  - Telemetry - Neuro checks q2 - Carotid U/S pending  - Echocardiogram - c/s neuro stroke team, appreciate recs - c/s SLP, PT, OT - Note that [facility] will not [facility] inpatient rehab. Consider discharge to SNF with rehab coverage that accepts [facility]. Will consult SW. - Transition from Aspirin to Plavix  # AKI:  Cr 1.49 <<- from Cr 2.11 at admission, baseline 0.8 in 2008 however no data after that time until current hospitalization - follow BMP - IVF discontinued  # Hypertension: Takes Nifedipine 120 mg daily, Lasix 80 mg daily, KDUR 20 meq BID. Initially held for permissive hypertension in setting of acute CVA.  - Monitor BP.  - Restart Nifedipine and Furosemide.  - Hydralazine PRN for BP >180/110  # Diabetes Mellitus: Pt unsure of what medications he takes at home. Per Epic he takes; Novolog 60 units TID, Lantus 15-18 units qhs, and Humalog 1-10 units TID. A1C 10.5. Glucose stable since admission. - Hold home medications  - Lantus 10 units qhs - Moderate SSI, HS - CBGs  # Seizure Disorder: Antiepileptic was changed to Lamictal a few weeks ago.  - Lamictal increased to 300 mg daily - Ativan 2 mg PRN for seizures lasting > 5 mins - Neurology consulted.  FEN/GI: SLIV,  heart healthy carb modified diet Prophylaxis: Lovenox  Disposition: SNF pending bed assignment  Subjective:  Had one seizure over night lasting less than 5 mins, not requiring ativan. Otherwise denies complaints this AM, no SOB, chest pain  Objective: Temp:  [98.2 F (36.8 C)-98.4 F (36.9 C)] 98.4 F (36.9 C) (02/12 0529) Pulse Rate:  [63-83] 67 (02/12 0529) Resp:  [18-20] 20 (02/12 0529) BP: (156-192)/(76-106) 181/85 mmHg (02/12 0529) SpO2:  [96 %-100 %] 96 % (02/12 0529) Physical Exam: General: 55yo male resting comfortably in no apparent distress Cardiovascular: regular rate and rhythm, normal s1 and s2, no murmurs Respiratory: normal work of breathing, clear to auscultation bilaterally Abdomen: soft, non distended, non tender, no masses, normal bowel sounds Neuro: Alert and oriented x 3, CN 2-12 intact, 4/5 strength in bilateral left UE and LE improved slightly from last exam. 5/5 strength in right UE and LE. Sensation intact throughout. Pronator drift of left arm Psych: normal mood and affect  Laboratory:  Recent Labs Lab 05/11/15 1135 05/11/15 1137 05/12/15 0320 05/13/15 1038  WBC 7.5  --  8.2 7.8  HGB 12.1* 12.9* 10.5* 11.6*  HCT 36.6* 38.0* 33.3* 36.7*  PLT 325  --  316 325    Recent Labs Lab 05/11/15 1135  05/12/15 0320 05/13/15 1038 05/14/15 0650  NA 140  < > 142 142 141  K 4.2  < > 3.8 4.2 3.6  CL 109  < > 112* 113* 109  CO2 21*  --  BUN 20  < >  CREATININE 2.11*  < > 1.45* 1.49* 1.49*  CALCIUM 8.7*  --  8.0* 8.7* 8.9  PROT 6.1*  --   --   --   --   BILITOT 0.2*  --   --   --   --   ALKPHOS 90  --   --   --   --   ALT 13*  --   --   --   --   AST 21  --   --   --   --   GLUCOSE 304*  < > 165* 116* 119*  < > = values in this interval not displayed.  Bonney Aid, MD 05/14/2015, 9:37 AM PGY-2, [facility] FPTS Intern pager: [phone], text pages welcome

## 2015-05-15 LAB — GLUCOSE, CAPILLARY
GLUCOSE-CAPILLARY: 162 mg/dL — AB (ref 65–99)
GLUCOSE-CAPILLARY: 163 mg/dL — AB (ref 65–99)
Glucose-Capillary: 126 mg/dL — ABNORMAL HIGH (ref 65–99)
Glucose-Capillary: 116 mg/dL — ABNORMAL HIGH (ref 65–99)

## 2015-05-15 NOTE — Progress Notes (Signed)
EP service was called to consider loop recorder implant for this patient.  Discussed with Neurology ARNP, they do not feel the etiology of his stroke was embolic. Left atrial enlargement is not currently an indication for loop implant.  Thank-you, We remain available if needed.  Francis Dowse, PA-C

## 2015-05-15 NOTE — Progress Notes (Signed)
Message left for [place] CSW with [facility] informing her of patients admission. CM following for discharge needs.

## 2015-05-15 NOTE — Progress Notes (Signed)
Physical Therapy Treatment Patient Details Name: Donald Marks MRN: [id number] DOB: [birth year] Today's Date: 05/15/2015    History of Present Illness adm with Lt sided weakness; + Rt caudate and putamen infarct; +seizures while hospitalized PMHx-seizures (~1 per week); DM, HTN, MI    PT Comments    Patient reports he worked on ROM/exercises with LUE and LLE throughout the weekend. Patient with improved strength and mobility, however continues to need min assist for balance (even with RW). Pt lives alone and he would benefit most from SNF for continued therapies before home alone. ? If patient will agree to this plan as he would not commit to it during our session. Updated Donald Marks, Case Manager and provided her with pt's [facility] Social Worker name and number (per patient).   Follow Up Recommendations  SNF;Supervision for mobility/OOB (CIR not an option due to [employer] (per CIR case mgr))     Equipment Recommendations  Other (comment) (TBA)    Recommendations for Other Services Rehab consult;OT consult     Precautions / Restrictions Precautions Precautions: Fall    Mobility  Bed Mobility Overal bed mobility: Needs Assistance Bed Mobility: Supine to Sit     Supine to sit: Min guard     General bed mobility comments: HOB flat, +rail; incr time and effort with near need for assist to achieve upright  Transfers Overall transfer level: Needs assistance Equipment used: Rolling walker (2 wheeled);None Transfers: Sit to/from Stand Sit to Stand: Min assist;Min guard         General transfer comment: vc for safe use of RW; repeated x 3 with improvement   Ambulation/Gait Ambulation/Gait assistance: Min assist Ambulation Distance (Feet): 50 Feet (seated rest; 50) Assistive device: Rolling walker (2 wheeled);1 person hand held assist Gait Pattern/deviations: Step-through pattern;Decreased stride length   Gait velocity interpretation: Below normal speed for age/gender General  Gait Details: LLE strength and control much improved. As he fatigued ~50 ft, his left knee began to "give" into flexion in weight-bearing and noted Lt knee hyperextension with fatigue   Stairs            Wheelchair Mobility    Modified Rankin (Stroke Patients Only) Modified Rankin (Stroke Patients Only) Pre-Morbid Rankin Score: No symptoms Modified Rankin: Moderately severe disability     Balance           Standing balance support: Single extremity supported Standing balance-Leahy Scale: Poor Standing balance comment: standing balance activities x 5 minutes                    Cognition Arousal/Alertness: Awake/alert Behavior During Therapy: Flat affect Overall Cognitive Status: Within Functional Limits for tasks assessed                      Exercises General Exercises - Lower Extremity Ankle Circles/Pumps: AROM;Left;5 reps Long Arc Quad: AROM;Left;5 reps Heel Slides: AROM;Left;5 reps Hip ABduction/ADduction: Right;Left;5 reps;Standing;AAROM (bil UE support; alternating sides) Hip Flexion/Marching: AAROM;Both;10 reps;Standing (light bil UE support progressed to single UE support) Toe Raises: AAROM;Both;5 reps;Standing Heel Raises: AAROM;Both;5 reps;Standing Mini-Sqauts: AAROM;Both;10 reps (bil UE and assist for balance) Other Exercises Other Exercises: dynamic standing balance/weight-shifting without UE support with min assist for balance    General Comments        Pertinent Vitals/Pain Pain Assessment: No/denies pain    Home Living               Home Equipment: None (pt now reports he has no equipment)  Prior Function            PT Goals (current goals can now be found in the care plan section) Acute Rehab PT Goals Patient Stated Goal: return to his home PT Goal Formulation: Patient unable to participate in goal setting (leaving for testing) Time For Goal Achievement: 05/19/15 Potential to Achieve Goals: Good Progress  towards PT goals: Progressing toward goals    Frequency  Min 3X/week    PT Plan Discharge plan needs to be updated;Frequency needs to be updated    Co-evaluation             End of Session Equipment Utilized During Treatment: Gait belt Activity Tolerance: Patient tolerated treatment well Patient left: in chair;with call bell/phone within reach;with chair alarm set     Time: 1116-1150 PT Time Calculation (min) (ACUTE ONLY): 34 min  Charges:  $Gait Training: 23-37 mins                    G Codes:      Donald Marks 2015-05-30, 12:11 PM Pager [phone]

## 2015-05-16 ENCOUNTER — Encounter (HOSPITAL_COMMUNITY): Payer: Self-pay | Admitting: Emergency Medicine

## 2015-05-16 ENCOUNTER — Observation Stay (HOSPITAL_COMMUNITY)
Admission: EM | Admit: 2015-05-16 | Discharge: 2015-05-19 | Disposition: A | Discharge status: Skilled Nursing Facility | Payer: Non-veteran care | Attending: Family Medicine | Admitting: Family Medicine

## 2015-05-16 ENCOUNTER — Inpatient Hospital Stay (HOSPITAL_COMMUNITY): Payer: Non-veteran care

## 2015-05-16 ENCOUNTER — Emergency Department (HOSPITAL_COMMUNITY): Payer: Non-veteran care

## 2015-05-16 DIAGNOSIS — R809 Proteinuria, unspecified: Secondary | ICD-10-CM | POA: Insufficient documentation

## 2015-05-16 DIAGNOSIS — Z951 Presence of aortocoronary bypass graft: Secondary | ICD-10-CM | POA: Insufficient documentation

## 2015-05-16 DIAGNOSIS — IMO0001 Reserved for inherently not codable concepts without codable children: Secondary | ICD-10-CM | POA: Insufficient documentation

## 2015-05-16 DIAGNOSIS — E119 Type 2 diabetes mellitus without complications: Secondary | ICD-10-CM | POA: Diagnosis not present

## 2015-05-16 DIAGNOSIS — W182XXA Fall in (into) shower or empty bathtub, initial encounter: Secondary | ICD-10-CM | POA: Insufficient documentation

## 2015-05-16 DIAGNOSIS — R4182 Altered mental status, unspecified: Secondary | ICD-10-CM

## 2015-05-16 DIAGNOSIS — E785 Hyperlipidemia, unspecified: Secondary | ICD-10-CM | POA: Insufficient documentation

## 2015-05-16 DIAGNOSIS — I252 Old myocardial infarction: Secondary | ICD-10-CM | POA: Insufficient documentation

## 2015-05-16 DIAGNOSIS — Z79899 Other long term (current) drug therapy: Secondary | ICD-10-CM | POA: Insufficient documentation

## 2015-05-16 DIAGNOSIS — I69354 Hemiplegia and hemiparesis following cerebral infarction affecting left non-dominant side: Secondary | ICD-10-CM | POA: Insufficient documentation

## 2015-05-16 DIAGNOSIS — I10 Essential (primary) hypertension: Secondary | ICD-10-CM | POA: Insufficient documentation

## 2015-05-16 DIAGNOSIS — I2510 Atherosclerotic heart disease of native coronary artery without angina pectoris: Secondary | ICD-10-CM | POA: Insufficient documentation

## 2015-05-16 DIAGNOSIS — Z794 Long term (current) use of insulin: Secondary | ICD-10-CM | POA: Insufficient documentation

## 2015-05-16 DIAGNOSIS — R569 Unspecified convulsions: Secondary | ICD-10-CM

## 2015-05-16 LAB — CBC WITH DIFFERENTIAL/PLATELET
BASOS ABS: 0.1 10*3/uL (ref 0.0–0.1)
Basophils Relative: 1 %
Eosinophils Absolute: 0.8 10*3/uL — ABNORMAL HIGH (ref 0.0–0.7)
Eosinophils Relative: 8 %
HEMATOCRIT: 37.9 % — AB (ref 39.0–52.0)
Hemoglobin: 12.2 g/dL — ABNORMAL LOW (ref 13.0–17.0)
LYMPHS PCT: 31 %
Lymphs Abs: 3 10*3/uL (ref 0.7–4.0)
MCH: 28.6 pg (ref 26.0–34.0)
MCHC: 32.2 g/dL (ref 30.0–36.0)
MCV: 88.8 fL (ref 78.0–100.0)
Monocytes Absolute: 0.7 10*3/uL (ref 0.1–1.0)
Monocytes Relative: 8 %
NEUTROS ABS: 5.1 10*3/uL (ref 1.7–7.7)
Neutrophils Relative %: 52 %
Platelets: 357 10*3/uL (ref 150–400)
RBC: 4.27 MIL/uL (ref 4.22–5.81)
RDW: 13.6 % (ref 11.5–15.5)
WBC: 9.6 10*3/uL (ref 4.0–10.5)

## 2015-05-16 LAB — GLUCOSE, CAPILLARY
Glucose-Capillary: 116 mg/dL — ABNORMAL HIGH (ref 65–99)
Glucose-Capillary: 133 mg/dL — ABNORMAL HIGH (ref 65–99)
Glucose-Capillary: 132 mg/dL — ABNORMAL HIGH (ref 65–99)

## 2015-05-16 MED ORDER — CLOPIDOGREL BISULFATE 75 MG PO TABS: 75.0000 mg | ORAL_TABLET | Freq: Every day | ORAL | Status: AC

## 2015-05-16 MED ORDER — LAMOTRIGINE 150 MG PO TABS: 300.0000 mg | ORAL_TABLET | Freq: Every day | ORAL | Status: DC

## 2015-05-16 NOTE — Discharge Instructions (Signed)
You were admitted to the hospital for a stroke. You will be sent to a skilled nursing facility to regain your strength. Please continue taking your medications as prescribed above. Additionally, when you leave the nursing facility you will need to make an appointment to see your PCP and neurologist.  Stroke Prevention Some medical conditions and behaviors are associated with an increased chance of having a stroke. You may prevent a stroke by making healthy choices and managing medical conditions. HOW CAN I REDUCE MY RISK OF HAVING A STROKE?   Stay physically active. Get at least 30 minutes of activity on most or all days.  Do not smoke. It may also be helpful to avoid exposure to secondhand smoke.  Limit alcohol use. Moderate alcohol use is considered to be:  No more than 2 drinks per day for men.  No more than 1 drink per day for nonpregnant women.  Eat healthy foods. This involves:  Eating 5 or more servings of fruits and vegetables a day.  Making dietary changes that address high blood pressure (hypertension), high cholesterol, diabetes, or obesity.  Manage your cholesterol levels.  Making food choices that are high in fiber and low in saturated fat, trans fat, and cholesterol may control cholesterol levels.  Take any prescribed medicines to control cholesterol as directed by your health care provider.  Manage your diabetes.  Controlling your carbohydrate and sugar intake is recommended to manage diabetes.  Take any prescribed medicines to control diabetes as directed by your health care provider.  Control your hypertension.  Making food choices that are low in salt (sodium), saturated fat, trans fat, and cholesterol is recommended to manage hypertension.  Ask your health care provider if you need treatment to lower your blood pressure. Take any prescribed medicines to control hypertension as directed by your health care provider.  If you are 53-28 years of age, have your  blood pressure checked every 3-5 years. If you are 31 years of age or older, have your blood pressure checked every year.  Maintain a healthy weight.  Reducing calorie intake and making food choices that are low in sodium, saturated fat, trans fat, and cholesterol are recommended to manage weight.  Stop drug abuse.  Avoid taking birth control pills.  Talk to your health care provider about the risks of taking birth control pills if you are over 55 years old, smoke, get migraines, or have ever had a blood clot.  Get evaluated for sleep disorders (sleep apnea).  Talk to your health care provider about getting a sleep evaluation if you snore a lot or have excessive sleepiness.  Take medicines only as directed by your health care provider.  For some people, aspirin or blood thinners (anticoagulants) are helpful in reducing the risk of forming abnormal blood clots that can lead to stroke. If you have the irregular heart rhythm of atrial fibrillation, you should be on a blood thinner unless there is a good reason you cannot take them.  Understand all your medicine instructions.  Make sure that other conditions (such as anemia or atherosclerosis) are addressed. SEEK IMMEDIATE MEDICAL CARE IF:   You have sudden weakness or numbness of the face, arm, or leg, especially on one side of the body.  Your face or eyelid droops to one side.  You have sudden confusion.  You have trouble speaking (aphasia) or understanding.  You have sudden trouble seeing in one or both eyes.  You have sudden trouble walking.  You have dizziness.  You have a loss of balance or coordination.  You have a sudden, severe headache with no known cause.  You have new chest pain or an irregular heartbeat. Any of these symptoms may represent a serious problem that is an emergency. Do not wait to see if the symptoms will go away. Get medical help at once. Call your local emergency services (911 in [place] Do not drive  yourself to the hospital.   This information is not intended to replace advice given to you by your health care provider. Make sure you discuss any questions you have with your health care provider.   Document Released: 04/25/2004 Document Revised: 04/08/2014 Document Reviewed: 09/18/2012 Elsevier Interactive Patient Education [employer]

## 2015-05-16 NOTE — Care Management Note (Signed)
Case Management Note  Patient Details  Name: Maijor Hornig MRN: [id number] Date of Birth: 1961/01/09  Subjective/Objective:                    Action/Plan: Patient is discharging to [facility] today and then he will go to [facility] in [place] on Thursday. CM spoke with Arline Asp at the [facility] in [place] [phone]) and informed her of the plan and that he will need transportation from [place] to [place] on Thursday. No further needs per CM.   Expected Discharge Date:                  Expected Discharge Plan:     In-House Referral:     Discharge planning Services     Post Acute Care Choice:    Choice offered to:     DME Arranged:    DME Agency:     HH Arranged:    HH Agency:     Status of Service:  In process, will continue to follow  Medicare Important Message Given:    Date Medicare IM Given:    Medicare IM give by:    Date Additional Medicare IM Given:    Additional Medicare Important Message give by:     If discussed at Long Length of Stay Meetings, dates discussed:    Additional Comments:  Kermit Balo, RN 05/16/2015, 3:26 PM

## 2015-05-16 NOTE — ED Provider Notes (Signed)
CSN: [id number]     Arrival date & time 05/16/15  2258 History  By signing my name below, I, Donald Marks, attest that this documentation has been prepared under the direction and in the presence of Gilda Crease, MD . Electronically Signed: [name], Scribe. 05/16/2015. 11:15 PM.      Chief Complaint  Patient presents with  . Seizures   LEVEL 5 CAVEAT DUE TO patient nonverbal   The history is provided by the nursing home, the EMS personnel and medical records. No language interpreter was used.     HPI Comments:  Donald Marks is a 55 y.o. male with a history of HTN, CVA, and seizure, who presents to the Emergency Department via EMS from [facility] with a complaint of seizure like activity  EMS notes pt was not given his meds today. Pt was discharged from [facility] today; he was admitted on 05/11/15 for acute CVA, hyperglycemia and essential HTN.  Past Medical History  Diagnosis Date  . MI (myocardial infarction) (HCC)   . Diabetes mellitus without complication (HCC)   . Hypertension   . Coronary artery disease   . Seizure (HCC)   . CVA (cerebral infarction)   . CVA (cerebral vascular accident) (HCC) 05/2015   Past Surgical History  Procedure Laterality Date  . Coronary artery bypass graft    . Cardiac catheterization    . Cholecystectomy    . Mouth surgery     Family History  Problem Relation Age of Onset  . Hyperlipidemia Mother   . Hypertension Father   . Hypertension Mother   . Hyperlipidemia Father   . Aneurysm Mother   . Aneurysm Maternal Grandmother   . Hypertension Maternal Grandmother   . Diabetes Father    Social History  Substance Use Topics  . Smoking status: Never Smoker   . Smokeless tobacco: Never Used  . Alcohol Use: No    Review of Systems  Unable to perform ROS: Patient nonverbal   Allergies  Contrast media  Home Medications   Prior to Admission medications   Medication Sig Start Date End Date Taking? Authorizing Provider   atorvastatin (LIPITOR) 80 MG tablet Take 80 mg by mouth daily.   Yes Historical Provider, MD  Cholecalciferol (VITAMIN D3) 2000 units capsule Take 2,000 Units by mouth daily.   Yes Historical Provider, MD  clopidogrel (PLAVIX) 75 MG tablet Take 1 tablet (75 mg total) by mouth daily. 05/16/15  Yes Beaulah Dinning, MD  furosemide (LASIX) 80 MG tablet Take 40 mg by mouth daily.   Yes Historical Provider, MD  insulin glargine (LANTUS) 100 UNIT/ML injection Inject 15-18 Units into the skin at bedtime. Patient uses sliding scale   Yes Historical Provider, MD  insulin lispro (HUMALOG) 100 UNIT/ML injection Inject 0-10 Units into the skin 3 (three) times daily before meals.    Yes Historical Provider, MD  lamoTRIgine (LAMICTAL) 150 MG tablet Take 2 tablets (300 mg total) by mouth daily. 05/16/15  Yes Beaulah Dinning, MD  magnesium oxide (MAG-OX) 400 MG tablet Take 400 mg by mouth daily.   Yes Historical Provider, MD  NIFEdipine (PROCARDIA-XL/ADALAT CC) 60 MG 24 hr tablet Take 120 mg by mouth daily.   Yes Historical Provider, MD  potassium chloride SA (K-DUR,KLOR-CON) 20 MEQ tablet Take 20 mEq by mouth 2 (two) times daily.   Yes Historical Provider, MD   BP 175/89 mmHg  Pulse 74  Temp(Src) 98.4 F (36.9 C) (Oral)  Resp 20  Ht   (1.803 m)  Wt 242 lb (109.77 kg)  BMI 33.77 kg/m2  SpO2 96% Physical Exam  Constitutional: He appears well-developed and well-nourished. No distress.  HENT:  Head: Normocephalic and atraumatic.  Right Ear: Hearing normal.  Left Ear: Hearing normal.  Nose: Nose normal.  Mouth/Throat: Oropharynx is clear and moist and mucous membranes are normal.  Eyes: Conjunctivae and EOM are normal. Pupils are equal, round, and reactive to light.  Neck: Normal range of motion. Neck supple.  Cardiovascular: Regular rhythm, S1 normal and S2 normal.  Exam reveals no gallop and no friction rub.   No murmur heard. Pulmonary/Chest: Effort normal and breath sounds normal. No  respiratory distress. He exhibits no tenderness.  Abdominal: Soft. Normal appearance and bowel sounds are normal. There is no hepatosplenomegaly. There is no tenderness. There is no rebound, no guarding, no tenderness at McBurney's point and negative Murphy's sign. No hernia.  Musculoskeletal: Normal range of motion.  Neurological: He is alert. He has normal strength. No cranial nerve deficit or sensory deficit. GCS eye subscore is 4. GCS verbal subscore is 1. GCS motor subscore is 6.  Non verbal Follows commands   Skin: Skin is warm, dry and intact. No rash noted. No cyanosis.  Psychiatric: His speech is normal.  Nursing note and vitals reviewed.   ED Course  Procedures   DIAGNOSTIC STUDIES:  Oxygen Saturation is 96% on RA, normal by my interpretation.    Labs Review Labs Reviewed  MRSA PCR SCREENING - Abnormal; Notable for the following:    MRSA by PCR POSITIVE (*)    All other components within normal limits  CBC WITH DIFFERENTIAL/PLATELET - Abnormal; Notable for the following:    Hemoglobin 12.2 (*)    HCT 37.9 (*)    Eosinophils Absolute 0.8 (*)    All other components within normal limits  COMPREHENSIVE METABOLIC PANEL - Abnormal; Notable for the following:    CO2 21 (*)    Glucose, Bld 135 (*)    Creatinine, Ser 2.02 (*)    Albumin 3.3 (*)    GFR calc non Af Amer 36 (*)    GFR calc Af Amer 41 (*)    All other components within normal limits  URINALYSIS, ROUTINE W REFLEX MICROSCOPIC (NOT AT [facility]) - Abnormal; Notable for the following:    APPearance CLOUDY (*)    Hgb urine dipstick TRACE (*)    Protein, ur >300 (*)    All other components within normal limits  URINE MICROSCOPIC-ADD ON - Abnormal; Notable for the following:    Squamous Epithelial / LPF 0-5 (*)    Bacteria, UA RARE (*)    Casts HYALINE CASTS (*)    All other components within normal limits  BASIC METABOLIC PANEL - Abnormal; Notable for the following:    Glucose, Bld 128 (*)    Creatinine, Ser 1.89  (*)    Calcium 8.8 (*)    GFR calc non Af Amer 39 (*)    GFR calc Af Amer 45 (*)    All other components within normal limits  CBC - Abnormal; Notable for the following:    RBC 4.04 (*)    Hemoglobin 11.7 (*)    HCT 36.3 (*)    All other components within normal limits  MICROALBUMIN / CREATININE URINE RATIO - Abnormal; Notable for the following:    Microalb, Ur 2341.7 (*)    Microalb Creat Ratio 2011.8 (*)    All other components within normal limits  GLUCOSE, CAPILLARY - Abnormal; Notable for the following:    Glucose-Capillary 127 (*)    All other components within normal limits  GLUCOSE, CAPILLARY - Abnormal; Notable for the following:    Glucose-Capillary 133 (*)    All other components within normal limits  CBC - Abnormal; Notable for the following:    RBC 4.11 (*)    Hemoglobin 11.5 (*)    HCT 36.0 (*)    All other components within normal limits  BASIC METABOLIC PANEL - Abnormal; Notable for the following:    Glucose, Bld 120 (*)    Creatinine, Ser 1.47 (*)    GFR calc non Af Amer 52 (*)    All other components within normal limits  GLUCOSE, CAPILLARY - Abnormal; Notable for the following:    Glucose-Capillary 102 (*)    All other components within normal limits  GLUCOSE, CAPILLARY - Abnormal; Notable for the following:    Glucose-Capillary 115 (*)    All other components within normal limits  GLUCOSE, CAPILLARY - Abnormal; Notable for the following:    Glucose-Capillary 109 (*)    All other components within normal limits  GLUCOSE, CAPILLARY - Abnormal; Notable for the following:    Glucose-Capillary 138 (*)    All other components within normal limits  BASIC METABOLIC PANEL - Abnormal; Notable for the following:    Glucose, Bld 102 (*)    Creatinine, Ser 1.53 (*)    GFR calc non Af Amer 50 (*)    GFR calc Af Amer 58 (*)    All other components within normal limits  GLUCOSE, CAPILLARY - Abnormal; Notable for the following:    Glucose-Capillary 108 (*)    All  other components within normal limits  GLUCOSE, CAPILLARY - Abnormal; Notable for the following:    Glucose-Capillary 148 (*)    All other components within normal limits  LAMOTRIGINE LEVEL  URINE RAPID DRUG SCREEN, HOSP PERFORMED  GLUCOSE, CAPILLARY    Imaging Review No results found. I have personally reviewed and evaluated these images and lab results as part of my medical decision-making.   EKG Interpretation Date/Time:  Tuesday May 16 2015 23:28:21 EST Ventricular Rate:  78 PR Interval:  171 QRS Duration: 113 QT Interval:  404 QTC Calculation: 460 R Axis:   70 Text Interpretation:  Sinus rhythm LVH with secondary repolarization  abnormality Anterior ST elevation, probably due to LVH No significant  change since last tracing Confirmed by POLLINA  MD, CHRISTOPHER [phone]) on  05/16/2015 11:46:41 PM      MDM   Final diagnoses:  Altered mental status, unspecified altered mental status type    Patient presented to the ER for evaluation of mental status changes. Patient reportedly had a seizure prior to arrival in the ER. He does have a history of seizures. He has not been given his other meds today. At arrival to the ER patient was awake and able to follow some commands, was completely nonverbal. Neurology consultation was obtained. It was felt that the patient might be having a new stroke, although it was felt that this was likely a postictal state. Cannot rule out continued seizures. It was recommended the patient be admitted for further evaluation.  I personally performed the services described in this documentation, which was scribed in my presence. The recorded information has been reviewed and is accurate.     Gilda Crease, MD 05/19/15 [phone]

## 2015-05-16 NOTE — ED Notes (Signed)
Per EMS, pt from [facility], with c/o "seizure like activity". Upon arrival pt was responsive to touch and voice. CBG- 135, BP- 163/97, HR-79, 98% 2L

## 2015-05-16 NOTE — Clinical Social Work Placement (Signed)
   CLINICAL SOCIAL WORK PLACEMENT  NOTE  Date:  05/16/2015  Patient Details  Name: Donald Marks MRN: [id number] Date of Birth: [birth year]  Clinical Social Work is seeking post-discharge placement for this patient at the Skilled  Nursing Facility level of care (*CSW will initial, date and re-position this form in  chart as items are completed):  Yes   Patient/family provided with [place] Clinical Social Work Department's list of facilities offering this level of care within the geographic area requested by the patient (or if unable, by the patient's family).  Yes   Patient/family informed of their freedom to choose among providers that offer the needed level of care, that participate in Medicare, Medicaid or managed care program needed by the patient, have an available bed and are willing to accept the patient.  Yes   Patient/family informed of [employer]'s ownership interest in [facility] and [facility], as well as of the fact that they are under no obligation to receive care at these facilities.  PASRR submitted to EDS on 05/16/15     PASRR number received on 05/16/15     Existing PASRR number confirmed on       FL2 transmitted to all facilities in geographic area requested by pt/family on 05/16/15     FL2 transmitted to all facilities within larger geographic area on 05/16/15     Patient informed that his/her managed care company has contracts with or will negotiate with certain facilities, including the following:        Yes   Patient/family informed of bed offers received.  Patient chooses bed at  [facility] )     Physician recommends and patient chooses bed at      Patient to be transferred to  [facility] ) on 05/16/15.  Patient to be transferred to facility by  [facility] )     Patient family notified on 05/16/15 of transfer.  Name of family member notified:   (Pt's sister, Porfirio Mylar)     PHYSICIAN Please prepare priority  discharge summary, including medications, Please sign FL2     Additional Comment:    _______________________________________________ Vaughan Browner, LCSW 05/16/2015, 5:31 PM

## 2015-05-16 NOTE — ED Notes (Signed)
Patient transported to CT 

## 2015-05-16 NOTE — Clinical Social Work Note (Signed)
[place] MUST PASARR obtained: [phone] Vaughan Browner, MSW, LCSWA [phone] 05/16/2015 5:35 PM

## 2015-05-16 NOTE — Progress Notes (Signed)
Family Medicine Teaching Service Daily Progress Note Intern Pager: [phone]  Patient name: Donald Marks Medical record number: [id number] Date of birth: March 08, 1961 Age: 55 y.o. Gender: male  Primary Care Provider: Sondra Come, MD Consultants: Neurology  Code Status: Full   Assessment and Plan: 55 y.o. male presenting with left hemiparesis, found to have CVA. PMH is significant for diabetes, hypertension, CAD, recent MI with stent placement, seizures and ?Pancreatic cancer.   Acute CVA: L hemiparesis since night of 2/8. MRI with acute CVA of right putamen and caudate. Passed swallow screen. EKG showing LVH and possible Q wave in lead 3. Echo from 1/28 showing EF 55-60% with concentric LVH, no valvular dysfunction. 2/12 Echocardiogram; EF 60% - 65% with G2DD with severe left atrial dilatation. Carotid U/S; No significant (1-39%) ICA stenosis. Cardiology consulted yesterday for abnormal left atrial dilation on Echocardiogram, no need for loop recorder per cards. - Telemetry - Neuro checks q 4 - SLP, PT, OT, neurology, and cardiology have all been consulted - Continue Plavix - Discharge to SNF with rehab coverage that accepts [facility], CSW on board, appreciate their assistance  AKI:  Resolving - IVF discontinued  Hypertension: Takes Nifedipine 120 mg daily, Lasix 80 mg daily, KDUR 20 meq BID. Initially held for permissive hypertension in setting of acute CVA.  - Monitor BP - Continue Nifedipine and Furosemide - Hydralazine PRN for BP >180/110  Diabetes Mellitus: Pt unsure of what medications he takes at home. Per Epic he takes; Novolog 60 units TID, Lantus 15-18 units qhs, and Humalog 1-10 units TID. A1C 10.5. Glucose stable since admission. - Hold home medications  - Lantus 10 units qhs - Moderate SSI, HS - CBGs  Seizure Disorder: Antiepileptic was changed to Lamictal a few weeks ago.  - Lamictal increased to 300 mg daily since 2/11 - Ativan 2 mg PRN for seizures lasting > 5 mins -  Neurology consulted.  FEN/GI: SLIV, heart healthy carb modified diet Prophylaxis: Lovenox  Disposition: Likely SNF today  Subjective:  - No acute events overnight, of note patient did fall yesterday on his face and his knees in the shower while bending over in the chair to get some soap.  - Endorses some right knee pain relieved with Tylenol and ice - Denies any chest pain, shortness of breath, or palpitations - Had BM yesterday  Objective: Temp:  [98.2 F (36.8 C)-99.1 F (37.3 C)] 98.2 F (36.8 C) (02/14 0518) Pulse Rate:  [70-81] 79 (02/14 0518) Resp:  [20] 20 (02/14 0518) BP: (126-161)/(67-85) 161/85 mmHg (02/14 0518) SpO2:  [97 %-99 %] 98 % (02/14 0518) Physical Exam: General: In NAD, sitting up in bed eating breakfast Cardiovascular: regular rate and rhythm, normal s1 and s2, no murmurs Respiratory: normal work of breathing, clear to auscultation bilaterally Abdomen: soft, non distended, non tender, no masses, normal bowel sounds Neuro: Alert and oriented x 3, CN 2-12 intact, 4/5 strength in bilateral left UE and LE improved slightly from last exam. 5/5 strength in right UE and LE. Sensation intact throughout. Pronator drift on the left  MSK: right knee swelling in lateral aspect with tenderness to deep palpation. Full ROM of bilateral knees. Psych: normal mood and affect  Laboratory:  Recent Labs Lab 05/11/15 1135 05/11/15 1137 05/12/15 0320 05/13/15 1038  WBC 7.5  --  8.2 7.8  HGB 12.1* 12.9* 10.5* 11.6*  HCT 36.6* 38.0* 33.3* 36.7*  PLT 325  --  316 325    Recent Labs Lab 05/11/15 1135  05/12/15 0320 05/13/15  1038 05/14/15 0650  NA 140  < > 142 142 141  K 4.2  < > 3.8 4.2 3.6  CL 109  < > 112* 113* 109  CO2 21*  --  BUN 20  < > CREATININE 2.11*  < > 1.45* 1.49* 1.49*  CALCIUM 8.7*  --  8.0* 8.7* 8.9  PROT 6.1*  --   --   --   --   BILITOT 0.2*  --   --   --   --   ALKPHOS 90  --   --   --   --   ALT 13*  --   --   --   --    AST 21  --   --   --   --   GLUCOSE 304*  < > 165* 116* 119*  < > = values in this interval not displayed.  Beaulah Dinning, MD 05/16/2015, 7:32 AM PGY-1, [place] [facility] FPTS Intern pager: [phone], text pages welcome

## 2015-05-16 NOTE — Clinical Social Work Note (Signed)
Clinical Social Worker facilitated patient discharge including contacting patient family and facility to confirm patient discharge plans.  Clinical information faxed to facility and family agreeable with plan.  CSW arranged ambulance transport via [facility] to [facility].  RN to call report prior to discharge.  Clinical Social Worker will sign off for now as social work intervention is no longer needed. Please consult [place] again if new need arises.  Derenda Fennel, MSW, LCSWA [phone] 05/16/2015 5:31 PM

## 2015-05-16 NOTE — NC FL2 (Signed)
[place] MEDICAID FL2 LEVEL OF CARE SCREENING TOOL     IDENTIFICATION  Patient Name: Donald Marks Birthdate: 1960/04/21 Sex: male Admission Date (Current Location): 05/11/2015  [facility] and [place] Number:  Producer, television/film/video and Address:  [facility] [place], [address], [place], [place] [address]      Provider Number: [id number]  Attending Physician Name and Address:  Doreene Eland, MD  Relative Name and Phone Number:       Current Level of Care: Hospital Recommended Level of Care: Skilled Nursing Facility Prior Approval Number:    Date Approved/Denied:   PASRR Number:    Discharge Plan: SNF    Current Diagnoses: Patient Active Problem List   Diagnosis Date Noted  . Elevated serum creatinine   . HLD (hyperlipidemia)   . Left hemiplegia (HCC) 05/12/2015  . Acute CVA (cerebrovascular accident) (HCC) 05/11/2015  . Stroke (cerebrum) (HCC) 05/11/2015  . CVA (cerebral infarction) 05/11/2015  . Hyperglycemia   . AKI (acute kidney injury) (HCC)   . Seizures (HCC)   . Uncontrolled type 2 diabetes mellitus with complication, with long-term current use of insulin (HCC)   . Essential hypertension     Orientation RESPIRATION BLADDER Height & Weight     Self, Time, Situation, Place  Normal Continent Weight: 244 lb (110.678 kg) Height:   (180.3 cm)  BEHAVIORAL SYMPTOMS/MOOD NEUROLOGICAL BOWEL NUTRITION STATUS   (NONE )  (NONE ) Continent Diet (CARB MODIFIED )  AMBULATORY STATUS COMMUNICATION OF NEEDS Skin   Limited Assist Verbally Normal                       Personal Care Assistance Level of Assistance  Bathing, Dressing, Feeding Bathing Assistance: Limited assistance Feeding assistance: Independent Dressing Assistance: Limited assistance     Functional Limitations Info  Sight, Hearing, Speech Sight Info: Adequate Hearing Info: Adequate Speech Info: Adequate    SPECIAL CARE FACTORS FREQUENCY  PT (By licensed PT), OT (By  licensed OT)     PT Frequency: 3 OT Frequency: 2            Contractures      Additional Factors Info  Code Status, Allergies Code Status Info: FULL CODE  Allergies Info: Contrast Media            Current Medications (05/16/2015):  This is the current hospital active medication list Current Facility-Administered Medications  Medication Dose Route Frequency Provider Last Rate Last Dose  . acetaminophen (TYLENOL) tablet 650 mg  650 mg Oral Q4H PRN Beaulah Dinning, MD   650 mg at 05/16/15 0220   Or  . acetaminophen (TYLENOL) suppository 650 mg  650 mg Rectal Q4H PRN Beaulah Dinning, MD      . atorvastatin (LIPITOR) tablet 80 mg  80 mg Oral Daily Beaulah Dinning, MD   80 mg at 05/15/15 1046  . clopidogrel (PLAVIX) tablet 75 mg  75 mg Oral Daily Diamond N Rumley, DO   75 mg at 05/15/15 1045  . enoxaparin (LOVENOX) injection 40 mg  40 mg Subcutaneous Q24H Beaulah Dinning, MD   40 mg at 05/15/15 1711  . furosemide (LASIX) tablet 40 mg  40 mg Oral Daily [place] N Rumley, DO   40 mg at 05/15/15 1046  . hydrALAZINE (APRESOLINE) injection 20 mg  20 mg Intravenous Q6H PRN [place] N Rumley, DO      . insulin aspart (novoLOG) injection 0-15 Units  0-15 Units  Subcutaneous TID WC Beaulah Dinning, MD   3 Units at 05/15/15 1711  . insulin aspart (novoLOG) injection 0-5 Units  0-5 Units Subcutaneous QHS Beaulah Dinning, MD   0 Units at 05/11/15 2200  . insulin glargine (LANTUS) injection 10 Units  10 Units Subcutaneous QHS Beaulah Dinning, MD   10 Units at 05/15/15 2243  . lamoTRIgine (LAMICTAL) tablet 300 mg  300 mg Oral Daily Layne Benton, NP   300 mg at 05/15/15 1046  . LORazepam (ATIVAN) injection 2 mg  2 mg Intramuscular PRN Beaulah Dinning, MD      . NIFEdipine (PROCARDIA-XL/ADALAT CC) 24 hr tablet 120 mg  120 mg Oral Daily  N Rumley, DO   120 mg at 05/15/15 1045  . nitroGLYCERIN (NITROSTAT) SL tablet 0.4 mg  0.4 mg Sublingual Q5 min PRN Marquette Saa, MD         Discharge Medications: Please see discharge summary for a list of discharge medications.  Relevant Imaging Results:  Relevant Lab Results:   Additional Information SSN [id number]  Derenda Fennel, MSW, LCSWA [phone] 05/16/2015 9:27 AM

## 2015-05-16 NOTE — Progress Notes (Signed)
Pt stated his right knee is hurting him from where he fell on it yesterday.  Ice pack applied and tylenol given. Will continue to monitor.   Estanislado Emms, RN

## 2015-05-16 NOTE — Progress Notes (Signed)
Pt discharging at this time to [facility] via Careers information officer. Neuro intact,no noted distress. IV discontinued, dry dressing applied. Discharge paperwork sent via transporters.  Report called in to nurse, Velna Hatchet.

## 2015-05-16 NOTE — Progress Notes (Signed)
Physical Therapy Treatment Patient Details Name: Donald Marks MRN: [id number] DOB: 12-30-1960 Today's Date: 05/16/2015    History of Present Illness adm with Lt sided weakness; + Rt caudate and putamen infarct; +seizures while hospitalized PMHx-seizures (~1 per week); DM, HTN, MI    PT Comments    Patient able to ambulate 24 feet with 1 seated rest taken due to reports of fatigue and rt knee pain. Will continue to attempt to progress mobility as tolerated.   Follow Up Recommendations  SNF;Supervision for mobility/OOB     Equipment Recommendations  Other (comment) (to be addressed at next venue)    Recommendations for Other Services       Precautions / Restrictions Precautions Precautions: Fall Precaution Comments: seizures Restrictions Weight Bearing Restrictions: No    Mobility  Bed Mobility               General bed mobility comments: in chair upon arrival  Transfers Overall transfer level: Needs assistance Equipment used: Rolling walker (2 wheeled);None Transfers: Sit to/from Stand Sit to Stand: Min assist         General transfer comment: no cues needed, reports rt knee sore.  Ambulation/Gait Ambulation/Gait assistance: Min assist Ambulation Distance (Feet): 24 Feet (12 feet X2) Assistive device: Rolling walker (2 wheeled) Gait Pattern/deviations: Step-through pattern;Decreased step length - right;Decreased step length - left Gait velocity: decreased   General Gait Details: patient reports pain with weight bearing on rt and decreased stability on LLE. Increased instability with increasing distance. Patient states distance limited by rt knee pain.    Stairs            Wheelchair Mobility    Modified Rankin (Stroke Patients Only)       Balance Overall balance assessment: Needs assistance Sitting-balance support: No upper extremity supported Sitting balance-Leahy Scale: Good     Standing balance support: Bilateral upper extremity  supported Standing balance-Leahy Scale: Poor Standing balance comment: using rw                    Cognition Arousal/Alertness: Awake/alert Behavior During Therapy: Flat affect Overall Cognitive Status: Within Functional Limits for tasks assessed                      Exercises      General Comments        Pertinent Vitals/Pain Pain Assessment: 0-10 Pain Score: 9  Pain Location: rt knee Pain Descriptors / Indicators: Nagging;Other (Comment) (hurts when putting pressure on it) Pain Intervention(s): Limited activity within patient's tolerance;Monitored during session;Ice applied    Home Living                      Prior Function            PT Goals (current goals can now be found in the care plan section) Acute Rehab PT Goals Patient Stated Goal: Get stronger and move better Time For Goal Achievement: 05/19/15 Potential to Achieve Goals: Good Progress towards PT goals: Progressing toward goals    Frequency  Min 3X/week    PT Plan Current plan remains appropriate    Co-evaluation             End of Session Equipment Utilized During Treatment: Gait belt Activity Tolerance: Patient limited by pain Patient left: in chair;with call bell/phone within reach;with chair alarm set     Time: 3086-5784 PT Time Calculation (min) (ACUTE ONLY): 21 min  Charges:  $Gait Training: 8-22  mins                    G Codes:      Christiane Ha, PT, CSCS Pager [phone] Office [phone]  05/16/2015, 3:42 PM

## 2015-05-17 ENCOUNTER — Emergency Department (HOSPITAL_COMMUNITY): Payer: Non-veteran care

## 2015-05-17 ENCOUNTER — Observation Stay (HOSPITAL_COMMUNITY): Payer: Non-veteran care

## 2015-05-17 DIAGNOSIS — R569 Unspecified convulsions: Secondary | ICD-10-CM

## 2015-05-17 DIAGNOSIS — R4182 Altered mental status, unspecified: Secondary | ICD-10-CM | POA: Diagnosis present

## 2015-05-17 LAB — URINE MICROSCOPIC-ADD ON

## 2015-05-17 LAB — COMPREHENSIVE METABOLIC PANEL
ALT: 27 U/L (ref 17–63)
AST: 33 U/L (ref 15–41)
Albumin: 3.3 g/dL — ABNORMAL LOW (ref 3.5–5.0)
Alkaline Phosphatase: 111 U/L (ref 38–126)
Anion gap: 13 (ref 5–15)
BUN: 17 mg/dL (ref 6–20)
CHLORIDE: 109 mmol/L (ref 101–111)
CO2: 21 mmol/L — AB (ref 22–32)
CREATININE: 2.02 mg/dL — AB (ref 0.61–1.24)
Calcium: 9.3 mg/dL (ref 8.9–10.3)
GFR, EST AFRICAN AMERICAN: 41 mL/min — AB (ref >60)
GFR, EST NON AFRICAN AMERICAN: 36 mL/min — AB (ref >60)
Glucose, Bld: 135 mg/dL — ABNORMAL HIGH (ref 65–99)
POTASSIUM: 3.8 mmol/L (ref 3.5–5.1)
SODIUM: 143 mmol/L (ref 135–145)
Total Bilirubin: 0.5 mg/dL (ref 0.3–1.2)
Total Protein: 6.9 g/dL (ref 6.5–8.1)

## 2015-05-17 LAB — URINALYSIS, ROUTINE W REFLEX MICROSCOPIC
Bilirubin Urine: NEGATIVE
Glucose, UA: NEGATIVE mg/dL
Ketones, ur: NEGATIVE mg/dL
Leukocytes, UA: NEGATIVE
NITRITE: NEGATIVE
PH: 5 (ref 5.0–8.0)
Protein, ur: >300 mg/dL — AB
SPECIFIC GRAVITY, URINE: 1.015 (ref 1.005–1.030)

## 2015-05-17 LAB — BASIC METABOLIC PANEL
ANION GAP: 10 (ref 5–15)
BUN: 15 mg/dL (ref 6–20)
CALCIUM: 8.8 mg/dL — AB (ref 8.9–10.3)
CO2: 23 mmol/L (ref 22–32)
Chloride: 109 mmol/L (ref 101–111)
Creatinine, Ser: 1.89 mg/dL — ABNORMAL HIGH (ref 0.61–1.24)
GFR, EST AFRICAN AMERICAN: 45 mL/min — AB (ref >60)
GFR, EST NON AFRICAN AMERICAN: 39 mL/min — AB (ref >60)
GLUCOSE: 128 mg/dL — AB (ref 65–99)
POTASSIUM: 3.7 mmol/L (ref 3.5–5.1)
SODIUM: 142 mmol/L (ref 135–145)

## 2015-05-17 LAB — CBC
HEMATOCRIT: 36.3 % — AB (ref 39.0–52.0)
Hemoglobin: 11.7 g/dL — ABNORMAL LOW (ref 13.0–17.0)
MCH: 29 pg (ref 26.0–34.0)
MCHC: 32.2 g/dL (ref 30.0–36.0)
MCV: 89.9 fL (ref 78.0–100.0)
PLATELETS: 341 10*3/uL (ref 150–400)
RBC: 4.04 MIL/uL — ABNORMAL LOW (ref 4.22–5.81)
RDW: 13.8 % (ref 11.5–15.5)
WBC: 9.4 10*3/uL (ref 4.0–10.5)

## 2015-05-17 LAB — GLUCOSE, CAPILLARY
GLUCOSE-CAPILLARY: 133 mg/dL — AB (ref 65–99)
Glucose-Capillary: 127 mg/dL — ABNORMAL HIGH (ref 65–99)
Glucose-Capillary: 102 mg/dL — ABNORMAL HIGH (ref 65–99)

## 2015-05-17 LAB — MRSA PCR SCREENING: MRSA by PCR: POSITIVE — AB

## 2015-05-17 MED ORDER — POTASSIUM CHLORIDE CRYS ER 20 MEQ PO TBCR
20.0000 meq | EXTENDED_RELEASE_TABLET | Freq: Two times a day (BID) | ORAL | Status: DC
  Administered 2015-05-17: 20 meq via ORAL
  Filled 2015-05-17: qty 1

## 2015-05-17 MED ORDER — CLOPIDOGREL BISULFATE 75 MG PO TABS
75.0000 mg | ORAL_TABLET | Freq: Every day | ORAL | Status: DC
  Administered 2015-05-17 – 2015-05-19 (×3): 75 mg via ORAL
  Filled 2015-05-17 (×3): qty 1

## 2015-05-17 MED ORDER — INSULIN GLARGINE 100 UNIT/ML ~~LOC~~ SOLN
10.0000 [IU] | Freq: Every day | SUBCUTANEOUS | Status: DC
  Administered 2015-05-17 – 2015-05-18 (×2): 10 [IU] via SUBCUTANEOUS
  Filled 2015-05-17 (×3): qty 0.1

## 2015-05-17 MED ORDER — SODIUM CHLORIDE 0.9% FLUSH
3.0000 mL | Freq: Two times a day (BID) | INTRAVENOUS | Status: DC
  Administered 2015-05-17 – 2015-05-18 (×4): 3 mL via INTRAVENOUS

## 2015-05-17 MED ORDER — INSULIN ASPART 100 UNIT/ML ~~LOC~~ SOLN
0.0000 [IU] | Freq: Three times a day (TID) | SUBCUTANEOUS | Status: DC
  Administered 2015-05-17 – 2015-05-18 (×2): 2 [IU] via SUBCUTANEOUS

## 2015-05-17 MED ORDER — ENOXAPARIN SODIUM 40 MG/0.4ML ~~LOC~~ SOLN
40.0000 mg | SUBCUTANEOUS | Status: DC
  Administered 2015-05-17 – 2015-05-19 (×3): 40 mg via SUBCUTANEOUS
  Filled 2015-05-17 (×3): qty 0.4

## 2015-05-17 MED ORDER — LAMOTRIGINE 150 MG PO TABS: 300.0000 mg | ORAL_TABLET | Freq: Every day | ORAL | Status: DC

## 2015-05-17 MED ORDER — LORAZEPAM 2 MG/ML IJ SOLN
2.0000 mg | INTRAMUSCULAR | Status: DC | PRN
  Administered 2015-05-18: 2 mg via INTRAVENOUS
  Filled 2015-05-17: qty 1

## 2015-05-17 MED ORDER — AMLODIPINE BESYLATE 5 MG PO TABS
5.0000 mg | ORAL_TABLET | Freq: Every day | ORAL | Status: DC
  Administered 2015-05-17 – 2015-05-18 (×2): 5 mg via ORAL
  Filled 2015-05-17 (×2): qty 1

## 2015-05-17 MED ORDER — SODIUM CHLORIDE 0.9 % IV SOLN
1000.0000 mg | Freq: Once | INTRAVENOUS | Status: AC
  Administered 2015-05-17: 1000 mg via INTRAVENOUS
  Filled 2015-05-17: qty 10

## 2015-05-17 MED ORDER — NIFEDIPINE ER 60 MG PO TB24
60.0000 mg | ORAL_TABLET | Freq: Every day | ORAL | Status: DC
  Administered 2015-05-17: 60 mg via ORAL
  Filled 2015-05-17: qty 1

## 2015-05-17 MED ORDER — LAMOTRIGINE 25 MG PO TABS
150.0000 mg | ORAL_TABLET | Freq: Two times a day (BID) | ORAL | Status: DC
  Administered 2015-05-17 – 2015-05-18 (×3): 150 mg via ORAL
  Filled 2015-05-17 (×3): qty 6

## 2015-05-17 NOTE — Progress Notes (Signed)
EEG completed; results pending.    

## 2015-05-17 NOTE — ED Notes (Signed)
Patient transported to MRI 

## 2015-05-17 NOTE — ED Notes (Signed)
Pt alert and oriented to person and time, disoriented to situation and and place. Pt asking questions, answering questions and following commands appropriately.

## 2015-05-17 NOTE — Progress Notes (Addendum)
FPTS Interim Progress Note  S:Patient continues to be confused and drowsy this morning. Responds with single word answers. Refuses to respond with full sentences. Denies any chest pain, dizziness, headaches, SOB, worsening focal weakness.   O: BP 148/83 mmHg  Pulse 73  Temp(Src) 98.4 F (36.9 C) (Oral)  Resp 16  Ht  (1.803 m)  Wt 242 lb (109.77 kg)  BMI 33.77 kg/m2  SpO2 99%  Physical Exam  General: NAD, lying in bed, very sleepy  Cardiovascular: RRR, no murmurs, rubs, gallops  Respiratory:CTAB, no rhonchi; limited due to patient compliance with commands Abdomen: BS+, no ttp, non distended, no rebound or rigidity  Neuro: Decreased strength in upper and lower left extremity 4/5, compared to right upper/lower extremity  Psych: Drowsy, orientated to self, place, and time    A/P:  Altered Mental Status following Seizures  - MRI negative for any new acute findings  - EEG pending  - Awaiting Neuro Recs  - Consider changing HTN regiment for better control Norvasc 5 mg from Procardia XL, - consider adding lisinopril once discussed with patient; as past hx, also discuss Lasix dosing with patient.  - Will provide diet, once swallow test is performed    Jazline Cumbee Mayra Reel, MD 05/17/2015, 9:10 AM PGY-1, [facility] Family [facility] Service pager [phone]

## 2015-05-17 NOTE — ED Notes (Signed)
Attempted report x1. 

## 2015-05-17 NOTE — Progress Notes (Signed)
Confirmed pt is from [facility] plan was for patient to DC to [facility] facility tomorrow (2/16)- CSW attempted to contact [facility] representative to discuss plan for pt- left message  CSW will continue to follow  Merlyn Lot, [facility] Clinical Social Worker [phone]

## 2015-05-17 NOTE — Consult Note (Signed)
Neurology Consultation Reason for Consult: Seizure Referring Physician: Pollina, C  CC: Seizure  History is obtained from: Chart review  HPI: Jaecion Dempster is a 55 y.o. male with a history of seizures since head injury as a child. He is on Lamictal at home, recently increased to 300 mg daily. He was discharged on 2/14 and came back later that day with breakthrough seizure. He was not returning quickly to baseline and therefore neurology has been consulted.  Currently, he has improved in his able to follow commands but is still not speaking. Apparently, he has never been fully controlled with his seizures.  LKW: Prior to most recent hospitalization tpa given?: no, recent stroke   ROS: Unable to obtain due to altered mental status.   Past Medical History  Diagnosis Date  . MI (myocardial infarction) (HCC)   . Diabetes mellitus without complication (HCC)   . Hypertension   . Coronary artery disease   . Seizure (HCC)   . CVA (cerebral infarction)   . CVA (cerebral vascular accident) (HCC) 05/2015     Family History  Problem Relation Age of Onset  . Hyperlipidemia Mother   . Hypertension Father   . Hypertension Mother   . Hyperlipidemia Father   . Aneurysm Mother   . Aneurysm Maternal Grandmother   . Hypertension Maternal Grandmother   . Diabetes Father      Social History:  reports that he has never smoked. He has never used smokeless tobacco. He reports that he does not drink alcohol or use illicit drugs.   Exam: Current vital signs: BP 164/91 mmHg  Pulse 74  Temp(Src) 99 F (37.2 C) (Oral)  Resp 12  SpO2 100% Vital signs in last 24 hours: Temp:  [97.9 F (36.6 C)-99 F (37.2 C)] 99 F (37.2 C) (02/14 2304) Pulse Rate:  [73-79] 74 (02/15 0100) Resp:  [12-20] 12 (02/15 0100) BP: (157-180)/(77-95) 164/91 mmHg (02/15 0100) SpO2:  [95 %-100 %] 100 % (02/15 0100)   Physical Exam  Constitutional: Appears well-developed and well-nourished.  Psych: Does not  speak Eyes: No scleral injection HENT: No OP obstrucion Head: Normocephalic.  Cardiovascular: Normal rate and regular rhythm.  Respiratory: Effort normal GI: Soft.  No distension. There is no tenderness.  Skin: WDI  Neuro: Mental Status: Patient is lethargic, he follows commands, but does not speak. He does not his head in response to some questions. Cranial Nerves: II: Blinks to threat bilaterally Pupils are equal, round, and reactive to light.   III,IV, VI: EOMI without ptosis or diploplia.  V: Facial sensation is symmetric to temperature VII: Facial movement is symmetric.  VIII: hearing is intact to voice X: Uvula elevates symmetrically XI: Shoulder shrug is symmetric. XII: tongue is midline without atrophy or fasciculations.  Motor: Tone is normal. Bulk is normal. 5/5 strength was present in all four extremities.  Sensory: Sensation is symmetric to light touch and temperature in the arms and legs. Cerebellar: FNF and HKS are intact bilaterally   I have reviewed labs in epic and the results pertinent to this consultation are: Mildly elevated creatinine  I have reviewed the images obtained: CT head-no clear acute findings  Impression: 54 year old male with breakthrough seizure in the setting of never being completely controlled. He had a recent infarct, likely due to small vessel disease. With his current state, I don't think he is able to take by mouth medications. He is on lamotrigine 300 mg daily, but this is a twice a day medication when not  given in the XR form.  Recommendations: 1) lamotrigine  twice a day, can use Keppra if unable to take PO 2) MRI brain given that he is not returning to his baseline. 3) EEG 4) neurology will continue to follow   Ritta Slot, MD [facility] [phone]  If 7pm- 7am, please page neurology on call as listed in AMION.

## 2015-05-17 NOTE — Procedures (Signed)
ELECTROENCEPHALOGRAM REPORT  Date of Study: 05/17/2015  Patient's Name: Donald Marks MRN: [id number] Date of Birth: 11/20/1960  Referring Provider: Dr. Pearlean Brownie  Clinical History: This is a 55 year old man with a history of seizures presenting with breakthrough seizure and recent infarct.   Medications: lamoTRIgine (LAMICTAL) tablet 150 mg clopidogrel (PLAVIX) tablet 75 mg  Technical Summary: A multichannel digital EEG recording measured by the international 10-20 system with electrodes applied with paste and impedances below 5000 ohms performed in our laboratory with EKG monitoring in a predominantly drowsy and asleep patient.  Hyperventilation and photic stimulation were not performed.  The digital EEG was referentially recorded, reformatted, and digitally filtered in a variety of bipolar and referential montages for optimal display.    Description: The patient is predominantly drowsy and asleep during the recording.  During brief period of wakefulness, there is a symmetric, medium voltage 9 Hz posterior dominant rhythm that attenuates with eye opening.  The record is symmetric.  During drowsiness and sleep, there is an increase in theta slowing of the background with low voltage vertex waves and sleep spindles were seen.  Hyperventilation and photic stimulation were not performed.  There were no epileptiform discharges or electrographic seizures seen.    EKG lead was unremarkable.  Impression: This predominantly drowsy and asleep EEG is normal.    Clinical Correlation: A normal EEG does not exclude a clinical diagnosis of epilepsy.  Clinical correlation is advised.   Patrcia Dolly, M.D.

## 2015-05-17 NOTE — Progress Notes (Signed)
BS swallow done per this nurse.swallow WNL

## 2015-05-17 NOTE — H&P (Signed)
Family Medicine Teaching [facility] Admission History and Physical Service Pager: [phone]  Patient name: Donald Marks Medical record number: [id number] Date of birth: 1960/04/03 Age: 55 y.o. Gender: male  Primary Care Provider: Sondra Come, MD Consultants: Neurology  Code Status: Full   Chief Complaint: Altered Mental Status  Assessment and Plan: Donald Marks is a 55 y.o. male presenting with . PMH is significant for diabetes, hypertension, CAD, recent MI with stent placement, seizures and ?Pancreatic cancer.   Altered Mental Status following Seizure: GSC on 13, opens eye to verbal command, however confused. Left hemiparesis on exam similar to previous neurologic evaluations done 2/14.  Patient with recent admission for Acute CVA on 2/9; following discharge today, patient had a seizure and was not verbally responsive  for 3 hours upon arrival to ED. Patient is now verbally responsive, though confused; seeming back to baseline post discharge today. CT Head was negative for hemorrhagic transformation, however concern for new neurologic insult vs post-ictal state.   - Admit to observation for neurologic follow up  - Will get EEG and MRI per neurology  - Will follow neuro recs - Will need to restart patient Lamictal 150 mg BID, received Keppra 1000 mg x 1 at 1 AM.   - Will get bedside swallow, NPO until then  - Neuro checks every 2 hours until baseline  - Vitals per floor  - Telemetry  - Morning CBC and BMET   Proteinuria: has had some protein in his urine but now nephrotic range proteinuria. CT scan done in 2008 showed normal sized kidneys. Possible to be related to his uncontrolled diabetes.  - microalbumin/creatinine ratio  - consider outpatient workup.   Recent CVA on 2/8: L hemiparesis since night of 2/8. MRI with acute CVA of right putamen and caudate - Transitioned to Plavix after failure on aspirin   - Holding atrovostatin for now   Seizure Disorder: hx of seizures  since childhood. Antiepileptic changed to Lamictal a few weeks ago. Patient with hx of seizure during recent hospitalization  - Lamictal increased to 300 mg daily since 2/11 - Ativan 2 mg PRN for seizures lasting > 5 mins  Hypertension: Takes Nifedipine 120 mg daily, Lasix 80 mg daily, KDUR 20 meq BID.  - Will hold home meds until bedside swallow - Consider restarting Nifedipine and Furosemide today once MRI  - Hydralazine PRN for BP >180/110  Diabetes Mellitus: Home on Lantus 15-18 units qhs, and Humalog 1-10 units TID. A1C 10.5.  - Lantus 10 units qhs - Moderate SSI, HS - CBGs  CAD: with hx of having triple heart bypass years ago and an MI on 04/29/15   FEN/GI: SLIV, heart healthy carb modified diet Prophylaxis: Lovenox  Disposition: Observation   History of Present Illness:  Donald Marks is a 55 y.o. male presenting with in a post-ictal state of about 3 hours duration to the Emergency Department. Patient was recently discharged today from family medicine service following admission for acute CVA on 2/9. He then arrived tonight via EMS from [facility], following seizure like activity.  EMS notes pt was not given his meds today. Per ED, patient was not verbal responsive for the past 3 hours, however became verbal responsive at around 2:45 AM. Patient was unable to give me any history of the past events of day. He was confused as to how he came to be at the hospital, and last memories are of being at [facility].   Unsure if the seizure was witnessed or  duration.   Review Of Systems: Per HPI with the following additions:  Review of Systems  Constitutional: Negative for fever and chills.  Eyes: Positive for discharge.  Respiratory: Negative for cough.   Cardiovascular: Negative for chest pain and palpitations.  Gastrointestinal: Negative for nausea and vomiting.  Genitourinary: Negative for dysuria and urgency.  Musculoskeletal: Negative for myalgias.  Neurological:  Positive for seizures. Negative for headaches.   Otherwise the remainder of the systems were negative.  Patient Active Problem List   Diagnosis Date Noted  . Seizure (HCC) 05/17/2015  . Altered mental state 05/17/2015  . Elevated serum creatinine   . HLD (hyperlipidemia)   . Left hemiplegia (HCC) 05/12/2015  . Acute CVA (cerebrovascular accident) (HCC) 05/11/2015  . Stroke (cerebrum) (HCC) 05/11/2015  . CVA (cerebral infarction) 05/11/2015  . Hyperglycemia   . AKI (acute kidney injury) (HCC)   . Seizures (HCC)   . Uncontrolled type 2 diabetes mellitus with complication, with long-term current use of insulin (HCC)   . Essential hypertension     Past Medical History: Past Medical History  Diagnosis Date  . MI (myocardial infarction) (HCC)   . Diabetes mellitus without complication (HCC)   . Hypertension   . Coronary artery disease   . Seizure (HCC)   . CVA (cerebral infarction)   . CVA (cerebral vascular accident) (HCC) 05/2015    Past Surgical History: Past Surgical History  Procedure Laterality Date  . Coronary artery bypass graft    . Cardiac catheterization    . Cholecystectomy    . Mouth surgery      Social History: Social History  Substance Use Topics  . Smoking status: Never Smoker   . Smokeless tobacco: Never Used  . Alcohol Use: No   Additional social history: none Please also refer to relevant sections of EMR.  Family History: Family History  Problem Relation Age of Onset  . Hyperlipidemia Mother   . Hypertension Father   . Hypertension Mother   . Hyperlipidemia Father   . Aneurysm Mother   . Aneurysm Maternal Grandmother   . Hypertension Maternal Grandmother   . Diabetes Father     Allergies and Medications: Allergies  Allergen Reactions  . Contrast Media [Iodinated Diagnostic Agents]    No current facility-administered medications on file prior to encounter.   Current Outpatient Prescriptions on File Prior to Encounter  Medication Sig  Dispense Refill  . atorvastatin (LIPITOR) 80 MG tablet Take 80 mg by mouth daily.    . Cholecalciferol (VITAMIN D3) 2000 units capsule Take 2,000 Units by mouth daily.    . clopidogrel (PLAVIX) 75 MG tablet Take 1 tablet (75 mg total) by mouth daily. 30 tablet 3  . furosemide (LASIX) 80 MG tablet Take 40 mg by mouth daily.    . insulin glargine (LANTUS) 100 UNIT/ML injection Inject 15-18 Units into the skin at bedtime. Patient uses sliding scale    . insulin lispro (HUMALOG) 100 UNIT/ML injection Inject 0-10 Units into the skin 3 (three) times daily before meals.     . lamoTRIgine (LAMICTAL) 150 MG tablet Take 2 tablets (300 mg total) by mouth daily. 60 tablet 3  . magnesium oxide (MAG-OX) 400 MG tablet Take 400 mg by mouth daily.    Marland Kitchen NIFEdipine (PROCARDIA-XL/ADALAT CC) 60 MG 24 hr tablet Take 120 mg by mouth daily.    . potassium chloride SA (K-DUR,KLOR-CON) 20 MEQ tablet Take 20 mEq by mouth 2 (two) times daily.      Objective: BP  142/83 mmHg  Pulse 71  Temp(Src) 99 F (37.2 C) (Oral)  Resp 14  SpO2 100% Exam: General: NAD, lying in bed, very sleepy  Eyes: PERRLA,  ENTM: Moist mucous membranes  Neck: No lymphadenopathy  Cardiovascular: RRR, no murmurs, rubs, gallops  Respiratory:CTAB, no rhonchi; limited due to patient compliance with commands Abdomen: BS+, no ttp, non distended, no rebound or rigidity  MSK: No lower extremity edema  Skin: no sores, ulcer, no jaundice  Neuro: Neurologic exam : Cn 2-7 intact; Decreased strength in left upper extremity 3-4/5 > 5/5 on right upper extremity; decreased strength 4/5 in left lower extremity > right lower extremity. Unable to assess finger to nose due to patient drowsy state.  Psych: Drowsy, orientated to self; needed orientation to place and time.   Labs and Imaging: CBC BMET   Recent Labs Lab 05/16/15 2333  WBC 9.6  HGB 12.2*  HCT 37.9*  PLT 357    Recent Labs Lab 05/16/15 2333  NA 143  K 3.8  CL 109  CO2 21*  BUN 17   CREATININE 2.02*  GLUCOSE 135*  CALCIUM 9.3     Dg Chest 1 View  05/17/2015  CLINICAL DATA:  55 year old male with mental status change EXAM: CHEST 1 VIEW COMPARISON:  Chest radiograph dated 07/10/2006 FINDINGS: Single view of the chest demonstrates cardiomegaly. Median sternotomy wires and CABG vascular clips noted. There is minimal prominence of the central vasculature may represent mild congestive changes. No focal consolidation, pleural effusion, or pneumothorax. No acute osseous pathology. IMPRESSION: Cardiomegaly with possible mild congestive changes. No focal consolidation. Electronically Signed   By: Elgie Collard M.D.   On: 05/17/2015 01:02   Ct Head Wo Contrast  05/17/2015  CLINICAL DATA:  55 year old male with altered mental status EXAM: CT HEAD WITHOUT CONTRAST TECHNIQUE: Contiguous axial images were obtained from the base of the skull through the vertex without intravenous contrast. COMPARISON:  MRI dated 05/11/2015 and head CT dated 07/10/2006 FINDINGS: There is no acute intracranial hemorrhage. The ventricles and sulci are appropriate in size for patient's age. Mild periventricular and deep white matter chronic microvascular ischemic changes noted. There is a a small area of infarct and encephalomalacia in the right periventricular white matter and corona radiata extending into the area of right internal capsule. There is no mass effect or midline shift. There is mild mucoperiosteal thickening of the left maxillary sinus. The remainder of the visualized paranasal sinuses and mastoid air cells are clear. The calvarium is intact. IMPRESSION: No acute intracranial hemorrhage. Periventricular and deep white matter chronic microvascular ischemic changes as well as a small focus of subacute/old infarct in the right periventricular white matter and right internal capsule. If symptoms persist and there are no contraindications, MRI may provide better evaluation if clinically indicated  Electronically Signed   By: Elgie Collard M.D.   On: 05/17/2015 00:42   Dg Knee Complete 4 Views Right  05/16/2015  CLINICAL DATA:  55 year old who fell yesterday while in the shower at home, injuring the right knee. Anterior and lateral pain. Initial encounter. EXAM: RIGHT KNEE - COMPLETE 4+ VIEW COMPARISON:  None. FINDINGS: No evidence of acute fracture or dislocation. Mild medial compartment joint space narrowing. Patellofemoral and lateral compartment joint spaces well preserved. Bone mineral density well-preserved. Circumscribed mixed lucent and sclerotic cortical lesion arising from the posterior cortex of the proximal tibial metaphysis. No other intrinsic osseous abnormality. Small to moderate-sized joint effusion. Mild prepatellar soft tissue swelling. IMPRESSION: 1. No acute osseous  abnormality. 2. Small to moderate-sized joint effusion. 3. 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 4. 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 Electronically Signed   By: Hulan Saas M.D.   On: 05/16/2015 15:26     [name], MD 05/17/2015, 3:39 AM PGY-1, [place] [facility] FPTS Intern pager: [phone], text pages welcome  Upper Level Addendum:  I have seen and evaluated this patient along with Dr. Cathlean Cower and reviewed the above note, making necessary revisions in [facility].   Clare Gandy, MD Family Medicine PGY-3

## 2015-05-18 ENCOUNTER — Observation Stay (HOSPITAL_COMMUNITY): Payer: Non-veteran care

## 2015-05-18 DIAGNOSIS — R251 Tremor, unspecified: Secondary | ICD-10-CM | POA: Diagnosis not present

## 2015-05-18 DIAGNOSIS — R569 Unspecified convulsions: Secondary | ICD-10-CM

## 2015-05-18 DIAGNOSIS — IMO0001 Reserved for inherently not codable concepts without codable children: Secondary | ICD-10-CM | POA: Insufficient documentation

## 2015-05-18 DIAGNOSIS — R4182 Altered mental status, unspecified: Secondary | ICD-10-CM

## 2015-05-18 LAB — CBC
HEMATOCRIT: 36 % — AB (ref 39.0–52.0)
Hemoglobin: 11.5 g/dL — ABNORMAL LOW (ref 13.0–17.0)
MCH: 28 pg (ref 26.0–34.0)
MCHC: 31.9 g/dL (ref 30.0–36.0)
MCV: 87.6 fL (ref 78.0–100.0)
PLATELETS: 351 10*3/uL (ref 150–400)
RBC: 4.11 MIL/uL — AB (ref 4.22–5.81)
RDW: 13.6 % (ref 11.5–15.5)
WBC: 8.4 10*3/uL (ref 4.0–10.5)

## 2015-05-18 LAB — BASIC METABOLIC PANEL
ANION GAP: 7 (ref 5–15)
BUN: 12 mg/dL (ref 6–20)
CHLORIDE: 111 mmol/L (ref 101–111)
CO2: 24 mmol/L (ref 22–32)
Calcium: 8.9 mg/dL (ref 8.9–10.3)
Creatinine, Ser: 1.47 mg/dL — ABNORMAL HIGH (ref 0.61–1.24)
GFR calc Af Amer: >60 mL/min (ref >60)
GFR, EST NON AFRICAN AMERICAN: 52 mL/min — AB (ref >60)
GLUCOSE: 120 mg/dL — AB (ref 65–99)
POTASSIUM: 3.7 mmol/L (ref 3.5–5.1)
Sodium: 142 mmol/L (ref 135–145)

## 2015-05-18 LAB — RAPID URINE DRUG SCREEN, HOSP PERFORMED
Amphetamines: NOT DETECTED
BARBITURATES: NOT DETECTED
BENZODIAZEPINES: NOT DETECTED
Cocaine: NOT DETECTED
Opiates: NOT DETECTED
Tetrahydrocannabinol: NOT DETECTED

## 2015-05-18 LAB — GLUCOSE, CAPILLARY
GLUCOSE-CAPILLARY: 115 mg/dL — AB (ref 65–99)
GLUCOSE-CAPILLARY: 109 mg/dL — AB (ref 65–99)
GLUCOSE-CAPILLARY: 138 mg/dL — AB (ref 65–99)
GLUCOSE-CAPILLARY: 108 mg/dL — AB (ref 65–99)
GLUCOSE-CAPILLARY: 148 mg/dL — AB (ref 65–99)

## 2015-05-18 LAB — MICROALBUMIN / CREATININE URINE RATIO
Creatinine, Urine: 116.4 mg/dL
MICROALB UR: 2341.7 ug/mL — AB
Microalb Creat Ratio: 2011.8 mg/g creat — ABNORMAL HIGH (ref 0.0–30.0)

## 2015-05-18 LAB — LAMOTRIGINE LEVEL: LAMOTRIGINE LVL: 10 ug/mL (ref 2.0–20.0)

## 2015-05-18 MED ORDER — AMLODIPINE BESYLATE 10 MG PO TABS
10.0000 mg | ORAL_TABLET | Freq: Every day | ORAL | Status: DC
  Administered 2015-05-19: 10 mg via ORAL
  Filled 2015-05-18: qty 1

## 2015-05-18 MED ORDER — HYDRALAZINE HCL 20 MG/ML IJ SOLN
10.0000 mg | INTRAMUSCULAR | Status: DC | PRN
  Administered 2015-05-18 – 2015-05-19 (×3): 10 mg via INTRAVENOUS
  Filled 2015-05-18 (×3): qty 1

## 2015-05-18 MED ORDER — AMMONIA AROMATIC IN INHA
0.3000 mL | Freq: Once | RESPIRATORY_TRACT | Status: DC
  Filled 2015-05-18 (×2): qty 10

## 2015-05-18 MED ORDER — AMMONIA AROMATIC IN INHA
0.3000 mL | RESPIRATORY_TRACT | Status: DC | PRN
  Filled 2015-05-18: qty 10

## 2015-05-18 MED ORDER — ATORVASTATIN CALCIUM 80 MG PO TABS: 80.0000 mg | ORAL_TABLET | Freq: Every day | ORAL | Status: DC

## 2015-05-18 NOTE — Progress Notes (Signed)
Family Medicine Teaching Service Daily Progress Note Intern Pager: [phone]  Patient name: Donald Marks Medical record number: [id number] Date of birth: 01-05-1961 Age: 55 y.o. Gender: male  Primary Care Provider: Sondra Come, MD Consultants: Neurology  Code Status: Full   Pt Overview and Major Events to Date:   Assessment and Plan:  Donald Marks is a 55 y.o. male presenting with . PMH is significant for diabetes, hypertension, CAD, recent MI with stent placement, seizures and ?Pancreatic cancer.   Altered Mental Status following Seizure: Seizure overnight last possibly up to 8 minutes; Ativan 2 mg given. Patient was incontinent of bowel and bladder after seizure, currently post-ictal.  Left hemiparesis on exam similar to previous neurologic evaluations done 2/14. Patient with recent admission for Acute CVA on 2/9; following discharge today, patient had a seizure and was not verbally responsive for 3 hours upon arrival to ED. Patient is now verbally responsive, though confused; seeming back to baseline post discharge today. CT Head was negative for hemorrhagic transformation. MRI negative for any acute intercranial process. EEG negative for any continued seizure like activity.  - Awaiting neuro recs today, should patient be started on Keppra considering continued break through seizures? - Continue Lamictal 150 mg BID -  EEG this AM - Neuro checks every 2 hours  - Seizure precautions  - Vitals per floor  - Morning CBC and BMET   Proteinuria: has had some protein in his urine but now nephrotic range proteinuria. CT scan done in 2008 showed normal sized kidneys. Possible to be related to his uncontrolled diabetes.  - microalbumin/creatinine ratio  - consider outpatient workup.   Recent CVA on 2/8: L hemiparesis since night of 2/8. MRI with acute CVA of right putamen and caudate - Transitioned to Plavix after failure on aspirin  - Restart Atrovostatin   Seizure Disorder: hx  of seizures since childhood. Antiepileptic changed to Lamictal a few weeks ago. Patient with hx of seizure during recent hospitalization  - Lamictal 150 mg BID  - Ativan 2 mg PRN for seizures lasting > 5 mins - Consider adding Keppera if patient unable to   Hypertension: BP 183/90 - Will start Norvasc 5 mg;  increasing 10 mg - Consider adding lisinopril at low dose tomorrow - Hydralazine PRN for BP >180/110  Diabetes Mellitus: Home on Lantus 15-18 units qhs, and Humalog 1-10 units TID. A1C 10.5.  - Lantus 10 units qhs - Moderate SSI, HS - CBGs  CAD: with hx of having triple heart bypass years ago and an MI on 04/29/15  FEN/GI: SLIV, heart healthy carb modified diet Prophylaxis: Lovenox  [facility]; Social work consulted.   Subjective:  Patient opens eyes with verbal command and will squeeze hands when commanded. However no verbal response this morning, mumbles to questions. Unable to ROS this morning   Objective: Temp:  [96.8 F (36 C)-98.3 F (36.8 C)] 98.3 F (36.8 C) (02/16 0619) Pulse Rate:  [69-71] 71 (02/16 0609) Resp:  [16-20] 16 (02/16 0609) BP: (155-210)/(85-92) 210/92 mmHg (02/16 0609) SpO2:  [96 %-100 %] 100 % (02/16 0609) Physical Exam: General: Lying in bed, NAD  Cardiovascular: RRR, no murmurs or gallops  Respiratory: CTAB, no wheezing, rhonchi  Abdomen: BS+, no ttp, no rebound  Extremities: no lower extremity edema.  Neuro: Limited Neuro exam strength in upper and lower extremity on right side 5/5, on left side 4/5  -- though diff cult to assess any further   Laboratory:  Recent Labs Lab 05/13/15 1038 05/16/15 2333 05/17/15  0504  WBC 7.8 9.6 9.4  HGB 11.6* 12.2* 11.7*  HCT 36.7* 37.9* 36.3*  PLT 325 357 341    Recent Labs Lab 05/11/15 1135  05/14/15 0650 05/16/15 2333 05/17/15 0504  NA 140  < > 141 143 142  K 4.2  < > 3.6 3.8 3.7  CL 109  < > 109 109 109  CO2 21*  < > 22 21* 23  BUN 20  < > CREATININE 2.11*  < > 1.49*  2.02* 1.89*  CALCIUM 8.7*  < > 8.9 9.3 8.8*  PROT 6.1*  --   --  6.9  --   BILITOT 0.2*  --   --  0.5  --   ALKPHOS 90  --   --  111  --   ALT 13*  --   --  27  --   AST 21  --   --  33  --   GLUCOSE 304*  < > 119* 135* 128*  < > = values in this interval not displayed.   Imaging/Diagnostic Tests: Dg Chest 1 View  05/17/2015  CLINICAL DATA:  55 year old male with mental status change EXAM: CHEST 1 VIEW COMPARISON:  Chest radiograph dated 07/10/2006 FINDINGS: Single view of the chest demonstrates cardiomegaly. Median sternotomy wires and CABG vascular clips noted. There is minimal prominence of the central vasculature may represent mild congestive changes. No focal consolidation, pleural effusion, or pneumothorax. No acute osseous pathology. IMPRESSION: Cardiomegaly with possible mild congestive changes. No focal consolidation. Electronically Signed   By: Elgie Collard M.D.   On: 05/17/2015 01:02   Ct Head Wo Contrast  05/17/2015  CLINICAL DATA:  55 year old male with altered mental status EXAM: CT HEAD WITHOUT CONTRAST TECHNIQUE: Contiguous axial images were obtained from the base of the skull through the vertex without intravenous contrast. COMPARISON:  MRI dated 05/11/2015 and head CT dated 07/10/2006 FINDINGS: There is no acute intracranial hemorrhage. The ventricles and sulci are appropriate in size for patient's age. Mild periventricular and deep white matter chronic microvascular ischemic changes noted. There is a a small area of infarct and encephalomalacia in the right periventricular white matter and corona radiata extending into the area of right internal capsule. There is no mass effect or midline shift. There is mild mucoperiosteal thickening of the left maxillary sinus. The remainder of the visualized paranasal sinuses and mastoid air cells are clear. The calvarium is intact. IMPRESSION: No acute intracranial hemorrhage. Periventricular and deep white matter chronic microvascular  ischemic changes as well as a small focus of subacute/old infarct in the right periventricular white matter and right internal capsule. If symptoms persist and there are no contraindications, MRI may provide better evaluation if clinically indicated Electronically Signed   By: Elgie Collard M.D.   On: 05/17/2015 00:42   Mr Brain Wo Contrast  05/17/2015  CLINICAL DATA:  Breakthrough seizure, history of seizures and remote head injury. History of hypertension, diabetes and stroke. EXAM: MRI HEAD WITHOUT CONTRAST TECHNIQUE: Multiplanar, multiecho pulse sequences of the brain and surrounding structures were obtained without intravenous contrast. COMPARISON:  CT head May 16, 2015 and MRI of the brain May 11, 2015 FINDINGS: Subcentimeter focus of reduced diffusion RIGHT corona radiata to internal capsule with normalized ADC values, present on prior MRI. No new foci of acute ischemia. Old bilateral basal ganglia and thalamus lacunar infarcts, faint susceptibility associated with RIGHT basal ganglia infarct. No lobar hematoma. Patchy to confluent supratentorial white matter FLAIR T2  hyperintensities are unchanged. No midline shift, mass effect or mass lesions. No abnormal extra-axial fluid collections. Normal major intracranial vascular flow voids present at skull base. Status post LEFT ocular lens implant. Mild LEFT maxillary mucosal thickening. No paranasal sinus air-fluid levels. Mastoid air cells are well aerated. No abnormal sellar expansion. No cerebellar tonsillar ectopia. No suspicious calvarial bone marrow signal. IMPRESSION: No acute intracranial process. Evolving subacute RIGHT corona radiata subcentimeter infarct. Old bilateral basal ganglia and thalamus lacunar infarcts. Moderate chronic small vessel ischemic disease, advanced for age though stable from prior imaging. Electronically Signed   By: Awilda Metro M.D.   On: 05/17/2015 04:06   Dg Knee Complete 4 Views Right  05/16/2015   CLINICAL DATA:  55 year old who fell yesterday while in the shower at home, injuring the right knee. Anterior and lateral pain. Initial encounter. EXAM: RIGHT KNEE - COMPLETE 4+ VIEW COMPARISON:  None. FINDINGS: No evidence of acute fracture or dislocation. Mild medial compartment joint space narrowing. Patellofemoral and lateral compartment joint spaces well preserved. Bone mineral density well-preserved. Circumscribed mixed lucent and sclerotic cortical lesion arising from the posterior cortex of the proximal tibial metaphysis. No other intrinsic osseous abnormality. Small to moderate-sized joint effusion. Mild prepatellar soft tissue swelling. IMPRESSION: 1. No acute osseous abnormality. 2. Small to moderate-sized joint effusion. 3. 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 4. 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 Electronically Signed   By: Hulan Saas M.D.   On: 05/16/2015 15:26     Kaileigh Viswanathan Mayra Reel, MD 05/18/2015, 8:05 AM PGY-1, [facility] FPTS Intern pager: [phone], text pages welcome

## 2015-05-18 NOTE — Progress Notes (Signed)
Updated Sister

## 2015-05-18 NOTE — Progress Notes (Signed)
Pt call light going off, nurse tech in to check on pt. Donald Marks, NT noticed pt was having a seizure. Upon arrive nurse noticed pt jerking about the bed, had relieved his bowels and bladder. Nurse Efraim Kaufmann) gave Ativan  IV. After the ativan, pt had quit jerking and was asked his name. Pt did mumble his name. When asked if he could tell when a seizure was coming on. Pt said, "no". Dr. Cathlean Cower notified of these findings.

## 2015-05-18 NOTE — Progress Notes (Signed)
Contacted Dr. Lavon Paganini regarding the prolonged EEG; stated to continue EEG overnight.

## 2015-05-18 NOTE — Clinical Social Work Placement (Signed)
   CLINICAL SOCIAL WORK PLACEMENT  NOTE  Date:  05/18/2015  Patient Details  Name: Donald Marks MRN: [id number] Date of Birth: [birth year]  Clinical Social Work is seeking post-discharge placement for this patient at the Skilled  Nursing Facility level of care (*CSW will initial, date and re-position this form in  chart as items are completed):  Yes   Patient/family provided with [facility] Clinical Social Work Department's list of facilities offering this level of care within the geographic area requested by the patient (or if unable, by the patient's family).  Yes   Patient/family informed of their freedom to choose among providers that offer the needed level of care, that participate in Medicare, Medicaid or managed care program needed by the patient, have an available bed and are willing to accept the patient.  Yes   Patient/family informed of [facility]'s ownership interest in [facility] and [facility], as well as of the fact that they are under no obligation to receive care at these facilities.  PASRR submitted to EDS on       PASRR number received on       Existing PASRR number confirmed on 05/18/15     FL2 transmitted to all facilities in geographic area requested by pt/family on 05/18/15     FL2 transmitted to all facilities within larger geographic area on       Patient informed that his/her managed care company has contracts with or will negotiate with certain facilities, including the following:        Yes   Patient/family informed of bed offers received.  Patient chooses bed at [facility]     Physician recommends and patient chooses bed at      Patient to be transferred to [facility] on 05/19/15.  Patient to be transferred to facility by [facility]     Patient family notified on 05/18/15 of transfer.  Name of family member notified:  sister     PHYSICIAN Please sign FL2     Additional Comment:     _______________________________________________ Izora Ribas, LCSW 05/18/2015, 4:49 PM

## 2015-05-18 NOTE — NC FL2 (Signed)
[place] MEDICAID FL2 LEVEL OF CARE SCREENING TOOL     IDENTIFICATION  Patient Name: Donald Marks Birthdate: February 12, 1961 Sex: male Admission Date (Current Location): 05/16/2015  [facility] and [place] Number:   ([facility])   Facility and Address:  [facility] [place], [address], [place], [place] [address]      Provider Number: [id number]  Attending Physician Name and Address:  Carney Living, MD  Relative Name and Phone Number:       Current Level of Care: Hospital Recommended Level of Care: Skilled Nursing Facility Prior Approval Number:    Date Approved/Denied:   PASRR Number: [id number]  Discharge Plan: SNF    Current Diagnoses: Patient Active Problem List   Diagnosis Date Noted  . Seizure (HCC) 05/17/2015  . Altered mental state 05/17/2015  . Elevated serum creatinine   . HLD (hyperlipidemia)   . Left hemiplegia (HCC) 05/12/2015  . Acute CVA (cerebrovascular accident) (HCC) 05/11/2015  . Stroke (cerebrum) (HCC) 05/11/2015  . CVA (cerebral infarction) 05/11/2015  . Hyperglycemia   . AKI (acute kidney injury) (HCC)   . Seizures (HCC)   . Uncontrolled type 2 diabetes mellitus with complication, with long-term current use of insulin (HCC)   . Essential hypertension     Orientation RESPIRATION BLADDER Height & Weight     Self, Place  Normal Incontinent Weight: 242 lb (109.77 kg) Height:   (180.3 cm)  BEHAVIORAL SYMPTOMS/MOOD NEUROLOGICAL BOWEL NUTRITION STATUS      Incontinent Diet (carc modified)  AMBULATORY STATUS COMMUNICATION OF NEEDS Skin   Limited Assist Verbally Normal                       Personal Care Assistance Level of Assistance  Bathing, Feeding, Dressing Bathing Assistance: Limited assistance Feeding assistance: Independent Dressing Assistance: Limited assistance     Functional Limitations Info    Sight Info: Adequate Hearing Info: Adequate Speech Info: Adequate    SPECIAL CARE FACTORS  FREQUENCY  PT (By licensed PT), OT (By licensed OT)     PT Frequency: 5/wk OT Frequency: 5/wk            Contractures      Additional Factors Info  Code Status, Allergies Code Status Info: FULL Allergies Info: Contrast Media           Current Medications (05/18/2015):  This is the current hospital active medication list Current Facility-Administered Medications  Medication Dose Route Frequency Provider Last Rate Last Dose  . [START ON 05/19/2015] amLODipine (NORVASC) tablet 10 mg  10 mg Oral Daily [name], MD      . atorvastatin (LIPITOR) tablet 80 mg  80 mg Oral q1800 [name], MD      . clopidogrel (PLAVIX) tablet 75 mg  75 mg Oral Daily [name], MD   75 mg at 05/18/15 1005  . enoxaparin (LOVENOX) injection 40 mg  40 mg Subcutaneous Q24H [name], MD   40 mg at 05/18/15 1005  . hydrALAZINE (APRESOLINE) injection 10 mg  10 mg Intravenous Q4H PRN [name], MD   10 mg at 05/18/15 1240  . insulin aspart (novoLOG) injection 0-15 Units  0-15 Units Subcutaneous TID WC [name], MD   2 Units at 05/18/15 1240  . insulin glargine (LANTUS) injection 10 Units  10 Units Subcutaneous QHS [name], MD   10 Units at 05/17/15 2216  . lamoTRIgine (LAMICTAL) tablet 150 mg  150 mg Oral BID [name], MD   150 mg at 05/18/15 1004  . LORazepam (ATIVAN) injection 2 mg  2 mg Intravenous PRN [name], MD   2 mg at 05/18/15 0547  . sodium chloride flush (NS) 0.9 % injection 3 mL  3 mL Intravenous Q12H [name], MD   3 mL at 05/18/15 1006     Discharge Medications: Please see discharge summary for a list of discharge medications.  Relevant Imaging Results:  Relevant Lab Results:   Additional Information SSN [id number]  Izora Ribas, [place]

## 2015-05-18 NOTE — Care Management Note (Signed)
Case Management Note  Patient Details  Name: Donald Marks MRN: [id number] Date of Birth: 16-Jun-1960  Subjective/Objective:                 Patient in obs from [facility] for seizure like activity. Undergoing extended EEG.    Action/Plan:  Will DC to [facility] as arranged through CSW.  Expected Discharge Date:                  Expected Discharge Plan:  Skilled Nursing Facility  In-House Referral:  Clinical Social Work  Discharge planning Services     Post Acute Care Choice:    Choice offered to:     DME Arranged:    DME Agency:     HH Arranged:    HH Agency:     Status of Service:  Completed, signed off  Medicare Important Message Given:    Date Medicare IM Given:    Medicare IM give by:    Date Additional Medicare IM Given:    Additional Medicare Important Message give by:     If discussed at Long Length of Stay Meetings, dates discussed:    Additional Comments:  Lawerance Sabal, RN 05/18/2015, 12:38 PM

## 2015-05-18 NOTE — Progress Notes (Deleted)
Family Medicine Teaching Service Daily Progress Note Intern Pager: [phone]  Patient name: Donald Marks Medical record number: [id number] Date of birth: [birth year] Age: 55 y.o. Gender: male  Primary Care Provider: Sondra Come, MD Consultants: Neurology  Code Status: Full   Pt Overview and Major Events to Date:   Assessment and Plan:  Donald Marks is a 55 y.o. male presenting with . PMH is significant for diabetes, hypertension, CAD, recent MI with stent placement, seizures and ?Pancreatic cancer.   Altered Mental Status following Seizure: Seizure overnight last possibly up to 8 minutes; Ativan 2 mg given. Patient was incontinent of bowel and bladder after seizure, currently post-ictal.  Left hemiparesis on exam similar to previous neurologic evaluations done 2/14. Patient with recent admission for Acute CVA on 2/9; following discharge today, patient had a seizure and was not verbally responsive for 3 hours upon arrival to ED. Patient is now verbally responsive, though confused; seeming back to baseline post discharge today. CT Head was negative for hemorrhagic transformation. MRI negative for any acute intercranial process. EEG negative for any continued seizure like activity.  - Awaiting neuro recs today, should patient be started on Keppra considering continued break through seizures?  - Continue Lamictal 150 mg BID -  EEG this AM - Neuro checks every 2 hours  - Seizure precautions  - Vitals per floor  - Morning CBC and BMET   Proteinuria: has had some protein in his urine but now nephrotic range proteinuria. CT scan done in 2008 showed normal sized kidneys. Possible to be related to his uncontrolled diabetes.  - microalbumin/creatinine ratio  - consider outpatient workup.   Recent CVA on 2/8: L hemiparesis since night of 2/8. MRI with acute CVA of right putamen and caudate - Transitioned to Plavix after failure on aspirin  - Restart Atrovostatin   Seizure Disorder:  hx of seizures since childhood. Antiepileptic changed to Lamictal a few weeks ago. Patient with hx of seizure during recent hospitalization  - Lamictal 150 mg BID  - Ativan 2 mg PRN for seizures lasting > 5 mins - Consider adding Keppera if patient unable to   Hypertension: Takes  - Will start Norvasc 5 mg; --> consider increasing 10 mg - Consider adding lisnopril once patient SCr level improved, possibly outpatient  - Hydralazine PRN for BP >180/110  Diabetes Mellitus: Home on Lantus 15-18 units qhs, and Humalog 1-10 units TID. A1C 10.5.  - Lantus 10 units qhs - Moderate SSI, HS - CBGs  CAD: with hx of having triple heart bypass years ago and an MI on 04/29/15  FEN/GI: SLIV, heart healthy carb modified diet Prophylaxis: Lovenox  [facility]: [facility]; Social work consulted.   Subjective:  Patient opens eyes with verbal command and will squeeze hands when commanded. However no verbal response this morning, mumbles to questions. Unable to ROS this morning   Objective: Temp:  [96.8 F (36 C)-98.3 F (36.8 C)] 98.3 F (36.8 C) (02/16 0619) Pulse Rate:  [69-71] 71 (02/16 0609) Resp:  [16-20] 16 (02/16 0609) BP: (167-210)/(85-92) 183/90 mmHg (02/16 1004) SpO2:  [96 %-100 %] 100 % (02/16 0609) Physical Exam: General: Lying in bed, NAD  Cardiovascular: RRR, no murmurs or gallops  Respiratory: CTAB, no wheezing, rhonchi  Abdomen: BS+, no ttp, no rebound  Extremities: no lower extremity edema.  Neuro: Limited Neuro exam strength in upper and lower extremity on right side 5/5, on left side 4/5  -- though diff cult to assess any further   Laboratory:  Recent Labs  Lab 05/16/15 2333 05/17/15 0504 05/18/15 0810  WBC 9.6 9.4 8.4  HGB 12.2* 11.7* 11.5*  HCT 37.9* 36.3* 36.0*  PLT 357 341 351    Recent Labs Lab 05/11/15 1135  05/16/15 2333 05/17/15 0504 05/18/15 0810  NA 140  < > 143 142 142  K 4.2  < > 3.8 3.7 3.7  CL 109  < > 109 109 111  CO2 21*  < > 21* 23 24  BUN 20   < > 17 15 12   CREATININE 2.11*  < > 2.02* 1.89* 1.47*  CALCIUM 8.7*  < > 9.3 8.8* 8.9  PROT 6.1*  --  6.9  --   --   BILITOT 0.2*  --  0.5  --   --   ALKPHOS 90  --  111  --   --   ALT 13*  --  27  --   --   AST 21  --  33  --   --   GLUCOSE 304*  < > 135* 128* 120*  < > = values in this interval not displayed.   Imaging/Diagnostic Tests: Dg Chest 1 View  05/17/2015  CLINICAL DATA:  55 year old male with mental status change EXAM: CHEST 1 VIEW COMPARISON:  Chest radiograph dated 07/10/2006 FINDINGS: Single view of the chest demonstrates cardiomegaly. Median sternotomy wires and CABG vascular clips noted. There is minimal prominence of the central vasculature may represent mild congestive changes. No focal consolidation, pleural effusion, or pneumothorax. No acute osseous pathology. IMPRESSION: Cardiomegaly with possible mild congestive changes. No focal consolidation. Electronically Signed   By: Elgie Collard M.D.   On: 05/17/2015 01:02   Ct Head Wo Contrast  05/17/2015  CLINICAL DATA:  55 year old male with altered mental status EXAM: CT HEAD WITHOUT CONTRAST TECHNIQUE: Contiguous axial images were obtained from the base of the skull through the vertex without intravenous contrast. COMPARISON:  MRI dated 05/11/2015 and head CT dated 07/10/2006 FINDINGS: There is no acute intracranial hemorrhage. The ventricles and sulci are appropriate in size for patient's age. Mild periventricular and deep white matter chronic microvascular ischemic changes noted. There is a a small area of infarct and encephalomalacia in the right periventricular white matter and corona radiata extending into the area of right internal capsule. There is no mass effect or midline shift. There is mild mucoperiosteal thickening of the left maxillary sinus. The remainder of the visualized paranasal sinuses and mastoid air cells are clear. The calvarium is intact. IMPRESSION: No acute intracranial hemorrhage. Periventricular and  deep white matter chronic microvascular ischemic changes as well as a small focus of subacute/old infarct in the right periventricular white matter and right internal capsule. If symptoms persist and there are no contraindications, MRI may provide better evaluation if clinically indicated Electronically Signed   By: Elgie Collard M.D.   On: 05/17/2015 00:42   Mr Brain Wo Contrast  05/17/2015  CLINICAL DATA:  Breakthrough seizure, history of seizures and remote head injury. History of hypertension, diabetes and stroke. EXAM: MRI HEAD WITHOUT CONTRAST TECHNIQUE: Multiplanar, multiecho pulse sequences of the brain and surrounding structures were obtained without intravenous contrast. COMPARISON:  CT head May 16, 2015 and MRI of the brain May 11, 2015 FINDINGS: Subcentimeter focus of reduced diffusion RIGHT corona radiata to internal capsule with normalized ADC values, present on prior MRI. No new foci of acute ischemia. Old bilateral basal ganglia and thalamus lacunar infarcts, faint susceptibility associated with RIGHT basal ganglia infarct. No lobar hematoma. Patchy to  confluent supratentorial white matter FLAIR T2 hyperintensities are unchanged. No midline shift, mass effect or mass lesions. No abnormal extra-axial fluid collections. Normal major intracranial vascular flow voids present at skull base. Status post LEFT ocular lens implant. Mild LEFT maxillary mucosal thickening. No paranasal sinus air-fluid levels. Mastoid air cells are well aerated. No abnormal sellar expansion. No cerebellar tonsillar ectopia. No suspicious calvarial bone marrow signal. IMPRESSION: No acute intracranial process. Evolving subacute RIGHT corona radiata subcentimeter infarct. Old bilateral basal ganglia and thalamus lacunar infarcts. Moderate chronic small vessel ischemic disease, advanced for age though stable from prior imaging. Electronically Signed   By: Awilda Metro M.D.   On: 05/17/2015 04:06   Dg Knee  Complete 4 Views Right  05/16/2015  CLINICAL DATA:  55 year old who fell yesterday while in the shower at home, injuring the right knee. Anterior and lateral pain. Initial encounter. EXAM: RIGHT KNEE - COMPLETE 4+ VIEW COMPARISON:  None. FINDINGS: No evidence of acute fracture or dislocation. Mild medial compartment joint space narrowing. Patellofemoral and lateral compartment joint spaces well preserved. Bone mineral density well-preserved. Circumscribed mixed lucent and sclerotic cortical lesion arising from the posterior cortex of the proximal tibial metaphysis. No other intrinsic osseous abnormality. Small to moderate-sized joint effusion. Mild prepatellar soft tissue swelling. IMPRESSION: 1. No acute osseous abnormality. 2. Small to moderate-sized joint effusion. 3. 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 4. 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 Electronically Signed   By: Hulan Saas M.D.   On: 05/16/2015 15:26     Olman Yono Mayra Reel, MD 05/18/2015, 11:06 AM PGY-1, [facility] FPTS Intern pager: [phone], text pages welcome

## 2015-05-18 NOTE — Progress Notes (Signed)
Subjective: Per nurse, at 645 he had a seizure. "right arm jerking , EYES SHUT CLOSED, incontinent of bowel and bladder." Currently he is lethargic, but quickly arouses to sternal rub and becomes angry if I continue to stimulate him, however when asked to stay awake he will close his eyes and not answer questions. HE is able to state he does take his medication--he mumbles and talks in low voice throughout encounter --even after asked to talk louder.   Objective: Current vital signs: BP 210/92 mmHg  Pulse 71  Temp(Src) 98.3 F (36.8 C) (Axillary)  Resp 16  Ht  (1.803 m)  Wt 109.77 kg (242 lb)  BMI 33.77 kg/m2  SpO2 100% Vital signs in last 24 hours: Temp:  [96.8 F (36 C)-98.3 F (36.8 C)] 98.3 F (36.8 C) (02/16 9147) Pulse Rate:  [69-71] 71 (02/16 0609) Resp:  [16-20] 16 (02/16 0609) BP: (155-210)/(85-92) 210/92 mmHg (02/16 0609) SpO2:  [96 %-100 %] 100 % (02/16 0609)  Intake/Output from previous day: 02/15 0701 - 02/16 0700 In: 732 [P.O.:732] Out: 1050 [Urine:1050] Intake/Output this shift:   Nutritional status: Diet Carb Modified Fluid consistency:: Thin; Room service appropriate?: Yes  Neurologic Exam: General: NAD Mental Status: lethargic, oriented.  Speech low voice and mumbled without evidence of aphasia.  Able to follow 3 step commands without difficulty. Cranial Nerves: II:  Visual fields grossly normal, pupils equal, round, reactive to light and accommodation III,IV, VI: ptosis not present, extra-ocular motions intact bilaterally V,VII: smile symmetric, facial light touch sensation normal bilaterally VIII: hearing normal bilaterally IX,X: uvula rises symmetrically XI: bilateral shoulder shrug XII: midline tongue extension without atrophy or fasciculations  Motor: Right : Upper extremity   5/5    Left:     Upper extremity   5/5  Lower extremity   5/5     Lower extremity   5/5 Tone and bulk:normal tone throughout; no atrophy noted Sensory: Pinprick and  light touch intact throughout, bilaterally Deep Tendon Reflexes:  Right: Upper Extremity   Left: Upper extremity   biceps (C-5 to C-6) 2/4   biceps (C-5 to C-6) 2/4 tricep (C7) 2/4    triceps (C7) 2/4 Brachioradialis (C6) 2/4  Brachioradialis (C6) 2/4  Lower Extremity Lower Extremity  quadriceps (L-2 to L-4) 1/4   quadriceps (L-2 to L-4) 1/4 Achilles (S1) 1/4   Achilles (S1) 1/4  Plantars: Right: downgoing   Left: downgoing   Lab Results: Basic Metabolic Panel:  Recent Labs Lab 05/12/15 0320 05/13/15 1038 05/14/15 0650 05/16/15 2333 05/17/15 0504  NA 142 142 141 143 142  K 3.8 4.2 3.6 3.8 3.7  CL 112* 113* 109 109 109  CO2 21* 23  GLUCOSE 165* 116* 119* 135* 128*  BUN CREATININE 1.45* 1.49* 1.49* 2.02* 1.89*  CALCIUM 8.0* 8.7* 8.9 9.3 8.8*    Liver Function Tests:  Recent Labs Lab 05/11/15 1135 05/16/15 2333  AST 21 33  ALT 13* 27  ALKPHOS 90 111  BILITOT 0.2* 0.5  PROT 6.1* 6.9  ALBUMIN 2.9* 3.3*   No results for input(s): LIPASE, AMYLASE in the last 168 hours. No results for input(s): AMMONIA in the last 168 hours.  CBC:  Recent Labs Lab 05/11/15 1135  05/12/15 0320 05/13/15 1038 05/16/15 2333 05/17/15 0504 05/18/15 0810  WBC 7.5  --  8.2 7.8 9.6 9.4 8.4  NEUTROABS 4.0  --   --   --  5.1  --   --  HGB 12.1*  < > 10.5* 11.6* 12.2* 11.7* 11.5*  HCT 36.6*  < > 33.3* 36.7* 37.9* 36.3* 36.0*  MCV 88.0  --  87.6 88.6 88.8 89.9 87.6  PLT 325  --  316 325 357 341 351  < > = values in this interval not displayed.  Cardiac Enzymes:  Recent Labs Lab 05/12/15 0033  TROPONINI <0.03    Lipid Panel:  Recent Labs Lab 05/12/15 0320  CHOL 204*  TRIG 139  HDL 35*  CHOLHDL 5.8  VLDL 28  LDLCALC 161*    CBG:  Recent Labs Lab 05/17/15 0812 05/17/15 1135 05/17/15 1728 05/18/15 0621 05/18/15 0802  GLUCAP 127* 133* 102* 115* 109*    Microbiology: Results for orders placed or performed during the hospital  encounter of 05/16/15  MRSA PCR Screening     Status: Abnormal   Collection Time: 05/17/15  7:59 AM  Result Value Ref Range Status   MRSA by PCR POSITIVE (A) NEGATIVE Final    Comment:        The GeneXpert MRSA Assay (FDA approved for NASAL specimens only), is one component of a comprehensive MRSA colonization surveillance program. It is not intended to diagnose MRSA infection nor to guide or monitor treatment for MRSA infections. RESULT CALLED TO, READ BACK BY AND VERIFIED WITH: ALaural Benes RN 12:10 05/17/15 ([email or web address])     Coagulation Studies: No results for input(s): LABPROT, INR in the last 72 hours.  Imaging: Dg Chest 1 View  05/17/2015  CLINICAL DATA:  55 year old male with mental status change EXAM: CHEST 1 VIEW COMPARISON:  Chest radiograph dated 07/10/2006 FINDINGS: Single view of the chest demonstrates cardiomegaly. Median sternotomy wires and CABG vascular clips noted. There is minimal prominence of the central vasculature may represent mild congestive changes. No focal consolidation, pleural effusion, or pneumothorax. No acute osseous pathology. IMPRESSION: Cardiomegaly with possible mild congestive changes. No focal consolidation. Electronically Signed   By: Elgie Collard M.D.   On: 05/17/2015 01:02   Ct Head Wo Contrast  05/17/2015  CLINICAL DATA:  55 year old male with altered mental status EXAM: CT HEAD WITHOUT CONTRAST TECHNIQUE: Contiguous axial images were obtained from the base of the skull through the vertex without intravenous contrast. COMPARISON:  MRI dated 05/11/2015 and head CT dated 07/10/2006 FINDINGS: There is no acute intracranial hemorrhage. The ventricles and sulci are appropriate in size for patient's age. Mild periventricular and deep white matter chronic microvascular ischemic changes noted. There is a a small area of infarct and encephalomalacia in the right periventricular white matter and corona radiata extending into the area of right internal  capsule. There is no mass effect or midline shift. There is mild mucoperiosteal thickening of the left maxillary sinus. The remainder of the visualized paranasal sinuses and mastoid air cells are clear. The calvarium is intact. IMPRESSION: No acute intracranial hemorrhage. Periventricular and deep white matter chronic microvascular ischemic changes as well as a small focus of subacute/old infarct in the right periventricular white matter and right internal capsule. If symptoms persist and there are no contraindications, MRI may provide better evaluation if clinically indicated Electronically Signed   By: Elgie Collard M.D.   On: 05/17/2015 00:42   Mr Brain Wo Contrast  05/17/2015  CLINICAL DATA:  Breakthrough seizure, history of seizures and remote head injury. History of hypertension, diabetes and stroke. EXAM: MRI HEAD WITHOUT CONTRAST TECHNIQUE: Multiplanar, multiecho pulse sequences of the brain and surrounding structures were obtained without intravenous contrast. COMPARISON:  CT head May 16, 2015 and MRI of the brain May 11, 2015 FINDINGS: Subcentimeter focus of reduced diffusion RIGHT corona radiata to internal capsule with normalized ADC values, present on prior MRI. No new foci of acute ischemia. Old bilateral basal ganglia and thalamus lacunar infarcts, faint susceptibility associated with RIGHT basal ganglia infarct. No lobar hematoma. Patchy to confluent supratentorial white matter FLAIR T2 hyperintensities are unchanged. No midline shift, mass effect or mass lesions. No abnormal extra-axial fluid collections. Normal major intracranial vascular flow voids present at skull base. Status post LEFT ocular lens implant. Mild LEFT maxillary mucosal thickening. No paranasal sinus air-fluid levels. Mastoid air cells are well aerated. No abnormal sellar expansion. No cerebellar tonsillar ectopia. No suspicious calvarial bone marrow signal. IMPRESSION: No acute intracranial process. Evolving  subacute RIGHT corona radiata subcentimeter infarct. Old bilateral basal ganglia and thalamus lacunar infarcts. Moderate chronic small vessel ischemic disease, advanced for age though stable from prior imaging. Electronically Signed   By: Awilda Metro M.D.   On: 05/17/2015 04:06   Dg Knee Complete 4 Views Right  05/16/2015  CLINICAL DATA:  55 year old who fell yesterday while in the shower at home, injuring the right knee. Anterior and lateral pain. Initial encounter. EXAM: RIGHT KNEE - COMPLETE 4+ VIEW COMPARISON:  None. FINDINGS: No evidence of acute fracture or dislocation. Mild medial compartment joint space narrowing. Patellofemoral and lateral compartment joint spaces well preserved. Bone mineral density well-preserved. Circumscribed mixed lucent and sclerotic cortical lesion arising from the posterior cortex of the proximal tibial metaphysis. No other intrinsic osseous abnormality. Small to moderate-sized joint effusion. Mild prepatellar soft tissue swelling. IMPRESSION: 1. No acute osseous abnormality. 2. Small to moderate-sized joint effusion. 3. 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 4. 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 Electronically Signed   By: Hulan Saas M.D.   On: 05/16/2015 15:26    Medications:  Scheduled: . amLODipine  5 mg Oral Daily  . clopidogrel  75 mg Oral Daily  . enoxaparin (LOVENOX) injection  40 mg Subcutaneous Q24H  . insulin aspart  0-15 Units Subcutaneous TID WC  . insulin glargine  10 Units Subcutaneous QHS  . lamoTRIgine  150 mg Oral BID  . sodium chloride flush  3 mL Intravenous Q12H    Assessment/Plan: 55 year old male with breakthrough seizure in the setting of never being completely controlled. MRI and EEG showing no acute abnormality. Had another seizure this AM. Questioning pseudoseizure. Will get EEG > 2 hours in hopes to capture seizure.        Felicie Morn  PA-C [facility] [phone]  05/18/2015, 9:11 AM

## 2015-05-18 NOTE — Progress Notes (Signed)
Responded to spiritual care consult to provide support to patient who were experiecing levels of anxiety and wanted  Prayer and to talk.  Patient indicated that he is loosing his will to live but did not express any intention to harm himself or any one else.  Patient is disappointment with his church because no one has visited with him since he  been here going on two weeks. 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 I explored with patient other concerns and provided empathetic listening, emotional and spiritual support including prayer.  Patient would welcome a follow up visit.  Patient will be transferred to [facility] when leaving [facility].  Venida Jarvis, Chaplain,Pager [phone]

## 2015-05-18 NOTE — Progress Notes (Signed)
Prolonged EEG started. 

## 2015-05-18 NOTE — Progress Notes (Addendum)
4:45pm [facility] called [facility] and informed that patient is still appropriate for admission to [place] [facility]- they will be able to admit patient on 05/23/15 (tuesday)  Would like DC summary faxed to [phone] Report#: [phone] or [phone] [facility] contact is Glee Arvin [phone] ext [phone] or Arline Asp [phone] Would like pick up at 8am on Tuesday  [facility] can accept patient back tomorrow (2/17) with LOG and pt sister is agreeable to this transfer  MD informed  8:40am CSW spoke with Arline Asp at the [place] concerning pt- Arline Asp requested that updated clinicals be faxed to the [place] (fax #: [phone]) to be reviewed at 1pm today for possible admission if pt is stable.  CSW faxed requested clinicals.  CSW will continue to follow  Merlyn Lot, [facility] Clinical Social Worker [phone]

## 2015-05-19 DIAGNOSIS — R251 Tremor, unspecified: Secondary | ICD-10-CM

## 2015-05-19 DIAGNOSIS — R4182 Altered mental status, unspecified: Secondary | ICD-10-CM | POA: Diagnosis not present

## 2015-05-19 DIAGNOSIS — R569 Unspecified convulsions: Secondary | ICD-10-CM | POA: Diagnosis not present

## 2015-05-19 LAB — BASIC METABOLIC PANEL
ANION GAP: 9 (ref 5–15)
BUN: 13 mg/dL (ref 6–20)
CALCIUM: 9.1 mg/dL (ref 8.9–10.3)
CO2: 24 mmol/L (ref 22–32)
Chloride: 109 mmol/L (ref 101–111)
Creatinine, Ser: 1.53 mg/dL — ABNORMAL HIGH (ref 0.61–1.24)
GFR calc Af Amer: 58 mL/min — ABNORMAL LOW (ref >60)
GFR, EST NON AFRICAN AMERICAN: 50 mL/min — AB (ref >60)
GLUCOSE: 102 mg/dL — AB (ref 65–99)
Potassium: 3.6 mmol/L (ref 3.5–5.1)
Sodium: 142 mmol/L (ref 135–145)

## 2015-05-19 LAB — GLUCOSE, CAPILLARY
Glucose-Capillary: 83 mg/dL (ref 65–99)
Glucose-Capillary: 107 mg/dL — ABNORMAL HIGH (ref 65–99)
Glucose-Capillary: 132 mg/dL — ABNORMAL HIGH (ref 65–99)

## 2015-05-19 MED ORDER — LAMOTRIGINE 25 MG PO TABS
150.0000 mg | ORAL_TABLET | Freq: Two times a day (BID) | ORAL | Status: DC
  Administered 2015-05-19: 150 mg via ORAL
  Filled 2015-05-19: qty 6

## 2015-05-19 MED ORDER — LISINOPRIL 5 MG PO TABS
5.0000 mg | ORAL_TABLET | Freq: Every day | ORAL | Status: DC
  Administered 2015-05-19: 5 mg via ORAL
  Filled 2015-05-19: qty 1

## 2015-05-19 MED ORDER — DIAZEPAM 2.5 MG RE GEL: 2.5000 mg | Freq: Once | RECTAL | Status: AC

## 2015-05-19 MED ORDER — LAMOTRIGINE 25 MG PO TABS: 150.0000 mg | ORAL_TABLET | Freq: Two times a day (BID) | ORAL | Status: DC

## 2015-05-19 MED ORDER — INSULIN GLARGINE 100 UNIT/ML ~~LOC~~ SOLN: 10.0000 [IU] | Freq: Every day | SUBCUTANEOUS | Status: AC

## 2015-05-19 MED ORDER — AMLODIPINE BESYLATE 10 MG PO TABS: 10.0000 mg | ORAL_TABLET | Freq: Every day | ORAL | Status: DC

## 2015-05-19 MED ORDER — LAMOTRIGINE 150 MG PO TABS: 150.0000 mg | ORAL_TABLET | Freq: Two times a day (BID) | ORAL | Status: AC

## 2015-05-19 MED ORDER — LISINOPRIL 5 MG PO TABS: 5.0000 mg | ORAL_TABLET | Freq: Every day | ORAL | Status: DC

## 2015-05-19 NOTE — Progress Notes (Signed)
Interval History:                                                                                                                      Donald Marks is an 55 y.o. male patient who presented with history of seizures, had a convulsive spell yesterday morning at 6 AM as described in my personal from history. Patient has been on Lamictal and previously evaluated with long-term EEG monitoring at [facility], admitted for 5 days at that time for spell characterization. Per report from patient, he did not have any convulsive spells during the long-term EEG monitoring. But he reports having some form of daily nocturnal spells concerning for seizures despite being on Lamictal. Due to concern for epileptic versus nonepileptic spells he was placed on long-term EEG monitoring over the past 24 hours for spell characterization. Unfortunately he did not have any spells in the past 24 hours while on the EEG, which is rather unusual per patient as he typically tends to have a spell on a daily basis.  No new neurological symptoms.     Past Medical History: Past Medical History  Diagnosis Date  . MI (myocardial infarction) (HCC)   . Diabetes mellitus without complication (HCC)   . Hypertension   . Coronary artery disease   . Seizure (HCC)   . CVA (cerebral infarction)   . CVA (cerebral vascular accident) (HCC) 05/2015    Past Surgical History  Procedure Laterality Date  . Coronary artery bypass graft    . Cardiac catheterization    . Cholecystectomy    . Mouth surgery      Family History: Family History  Problem Relation Age of Onset  . Hyperlipidemia Mother   . Hypertension Father   . Hypertension Mother   . Hyperlipidemia Father   . Aneurysm Mother   . Aneurysm Maternal Grandmother   . Hypertension Maternal Grandmother   . Diabetes Father     Social History:   reports that he has never smoked. He has never used smokeless tobacco. He reports that he does not drink alcohol or use illicit  drugs.  Allergies:  Allergies  Allergen Reactions  . Contrast Media [Iodinated Diagnostic Agents]      Medications:                                                                                                                         Current facility-administered medications:  .  amLODipine (NORVASC) tablet 10 mg, 10  mg, Oral, Daily, [name], MD, 10 mg at 05/19/15 1052 .  ammonia inhalant 0.3 mL, 0.3 mL, Inhalation, PRN, Nestor Ramp, MD .  atorvastatin (LIPITOR) tablet 80 mg, 80 mg, Oral, q1800, [name], MD, 80 mg at 05/18/15 2221 .  clopidogrel (PLAVIX) tablet 75 mg, 75 mg, Oral, Daily, [name], MD, 75 mg at 05/19/15 1051 .  enoxaparin (LOVENOX) injection 40 mg, 40 mg, Subcutaneous, Q24H, [name], MD, 40 mg at 05/19/15 1052 .  hydrALAZINE (APRESOLINE) injection 10 mg, 10 mg, Intravenous, Q4H PRN, [name], MD, 10 mg at 05/19/15 0603 .  insulin aspart (novoLOG) injection 0-15 Units, 0-15 Units, Subcutaneous, TID WC, [name], MD, 2 Units at 05/18/15 1240 .  insulin glargine (LANTUS) injection 10 Units, 10 Units, Subcutaneous, QHS, [name], MD, 10 Units at 05/18/15 2221 .  lamoTRIgine (LAMICTAL) tablet 150 mg, 150 mg, Oral, BID, [name], MD, 150 mg at 05/19/15 1501 .  lisinopril (PRINIVIL,ZESTRIL) tablet 5 mg, 5 mg, Oral, Daily, [name], MD, 5 mg at 05/19/15 1501 .  LORazepam (ATIVAN) injection 2 mg, 2 mg, Intravenous, PRN, [name], MD, 2 mg at 05/18/15 0547 .  sodium chloride flush (NS) 0.9 % injection 3 mL, 3 mL, Intravenous, Q12H, [name], MD, 3 mL at 05/18/15 2223   Neurologic Examination:                                                                                                      Blood pressure 175/89, pulse 74, temperature 98.4 F (36.9 C), temperature source Oral, resp. rate 20, height 5\' 11"  (1.803 m), weight 109.77 kg (242 lb), SpO2 96  %.  Patient not in distress. Grossly nonfocal neurological examination.    Lab Results: Basic Metabolic Panel:  Recent Labs Lab 05/14/15 0650 05/16/15 2333 05/17/15 0504 05/18/15 0810 05/19/15 0612  NA 141 143 142 142 142  K 3.6 3.8 3.7 3.7 3.6  CL 109 109 109 111 109  CO2 22 21* 23 24 24   GLUCOSE 119* 135* 128* 120* 102*  BUN 11 17 15 12 13   CREATININE 1.49* 2.02* 1.89* 1.47* 1.53*  CALCIUM 8.9 9.3 8.8* 8.9 9.1    Liver Function Tests:  Recent Labs Lab 05/16/15 2333  AST 33  ALT 27  ALKPHOS 111  BILITOT 0.5  PROT 6.9  ALBUMIN 3.3*   No results for input(s): LIPASE, AMYLASE in the last 168 hours. No results for input(s): AMMONIA in the last 168 hours.  CBC:  Recent Labs Lab 05/13/15 1038 05/16/15 2333 05/17/15 0504 05/18/15 0810  WBC 7.8 9.6 9.4 8.4  NEUTROABS  --  5.1  --   --   HGB 11.6* 12.2* 11.7* 11.5*  HCT 36.7* 37.9* 36.3* 36.0*  MCV 88.6 88.8 89.9 87.6  PLT 325 357 341 351    Cardiac Enzymes: No results for input(s): CKTOTAL, CKMB, CKMBINDEX, TROPONINI in the last 168 hours.  Lipid Panel: No results for input(s): CHOL, TRIG, HDL, CHOLHDL, VLDL, LDLCALC in the last 168  hours.  CBG:  Recent Labs Lab 05/18/15 1203 05/18/15 1701 05/18/15 2121 05/19/15 0752 05/19/15 1216  GLUCAP 138* 108* 148* 83 107*    Microbiology: Results for orders placed or performed during the hospital encounter of 05/16/15  MRSA PCR Screening     Status: Abnormal   Collection Time: 05/17/15  7:59 AM  Result Value Ref Range Status   MRSA by PCR POSITIVE (A) NEGATIVE Final    Comment:        The GeneXpert MRSA Assay (FDA approved for NASAL specimens only), is one component of a comprehensive MRSA colonization surveillance program. It is not intended to diagnose MRSA infection nor to guide or monitor treatment for MRSA infections. RESULT CALLED TO, READ BACK BY AND VERIFIED WITH: Karrie Doffing RN 12:10 05/17/15 ([email or web address])     Imaging: Dg Chest 1  View  05/17/2015  CLINICAL DATA:  55 year old male with mental status change EXAM: CHEST 1 VIEW COMPARISON:  Chest radiograph dated 07/10/2006 FINDINGS: Single view of the chest demonstrates cardiomegaly. Median sternotomy wires and CABG vascular clips noted. There is minimal prominence of the central vasculature may represent mild congestive changes. No focal consolidation, pleural effusion, or pneumothorax. No acute osseous pathology. IMPRESSION: Cardiomegaly with possible mild congestive changes. No focal consolidation. Electronically Signed   By: Elgie Collard M.D.   On: 05/17/2015 01:02   Ct Head Wo Contrast  05/17/2015  CLINICAL DATA:  55 year old male with altered mental status EXAM: CT HEAD WITHOUT CONTRAST TECHNIQUE: Contiguous axial images were obtained from the base of the skull through the vertex without intravenous contrast. COMPARISON:  MRI dated 05/11/2015 and head CT dated 07/10/2006 FINDINGS: There is no acute intracranial hemorrhage. The ventricles and sulci are appropriate in size for patient's age. Mild periventricular and deep white matter chronic microvascular ischemic changes noted. There is a a small area of infarct and encephalomalacia in the right periventricular white matter and corona radiata extending into the area of right internal capsule. There is no mass effect or midline shift. There is mild mucoperiosteal thickening of the left maxillary sinus. The remainder of the visualized paranasal sinuses and mastoid air cells are clear. The calvarium is intact. IMPRESSION: No acute intracranial hemorrhage. Periventricular and deep white matter chronic microvascular ischemic changes as well as a small focus of subacute/old infarct in the right periventricular white matter and right internal capsule. If symptoms persist and there are no contraindications, MRI may provide better evaluation if clinically indicated Electronically Signed   By: Elgie Collard M.D.   On: 05/17/2015 00:42    Mr Brain Wo Contrast  05/17/2015  CLINICAL DATA:  Breakthrough seizure, history of seizures and remote head injury. History of hypertension, diabetes and stroke. EXAM: MRI HEAD WITHOUT CONTRAST TECHNIQUE: Multiplanar, multiecho pulse sequences of the brain and surrounding structures were obtained without intravenous contrast. COMPARISON:  CT head May 16, 2015 and MRI of the brain May 11, 2015 FINDINGS: Subcentimeter focus of reduced diffusion RIGHT corona radiata to internal capsule with normalized ADC values, present on prior MRI. No new foci of acute ischemia. Old bilateral basal ganglia and thalamus lacunar infarcts, faint susceptibility associated with RIGHT basal ganglia infarct. No lobar hematoma. Patchy to confluent supratentorial white matter FLAIR T2 hyperintensities are unchanged. No midline shift, mass effect or mass lesions. No abnormal extra-axial fluid collections. Normal major intracranial vascular flow voids present at skull base. Status post LEFT ocular lens implant. Mild LEFT maxillary mucosal thickening. No paranasal sinus air-fluid levels. Mastoid air cells are  well aerated. No abnormal sellar expansion. No cerebellar tonsillar ectopia. No suspicious calvarial bone marrow signal. IMPRESSION: No acute intracranial process. Evolving subacute RIGHT corona radiata subcentimeter infarct. Old bilateral basal ganglia and thalamus lacunar infarcts. Moderate chronic small vessel ischemic disease, advanced for age though stable from prior imaging. Electronically Signed   By: Awilda Metro M.D.   On: 05/17/2015 04:06   Dg Knee Complete 4 Views Right  05/16/2015  CLINICAL DATA:  55 year old who fell yesterday while in the shower at home, injuring the right knee. Anterior and lateral pain. Initial encounter. EXAM: RIGHT KNEE - COMPLETE 4+ VIEW COMPARISON:  None. FINDINGS: No evidence of acute fracture or dislocation. Mild medial compartment joint space narrowing. Patellofemoral and  lateral compartment joint spaces well preserved. Bone mineral density well-preserved. Circumscribed mixed lucent and sclerotic cortical lesion arising from the posterior cortex of the proximal tibial metaphysis. No other intrinsic osseous abnormality. Small to moderate-sized joint effusion. Mild prepatellar soft tissue swelling. IMPRESSION: 1. No acute osseous abnormality. 2. Small to moderate-sized joint effusion. 3. 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 4. 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 Electronically Signed   By: Hulan Saas M.D.   On: 05/16/2015 15:26    Assessment and plan:   Donald Marks is an 55 y.o. male patient who presented with history of seizures. As described above in detail history of present illness, it is unclear if his seizure like episodes represent epileptic versus nonepileptic spells. Unfortunately he did not have any further spells during the last 24 hours of long-term video EEG monitoring. He had similar EEG monitoring at [facility] in the past for 5 days without having spells. It is rather unusual for the patient not to have spells during the monitoring as reported having nearly daily nocturnal spells. At this time, I'm not sure if continuing the long-term EEG monitoring for several more days to provide any more information given his history of being monitored in the past at [facility] epilepsy unit without having spells for several days. Hence I recommend discontinuing long-term EEG at this time. We will restart his prior Lamictal dose of 150 mg twice a day. He can be discharged home, with seizure precaution recommendations, and follow-up with his regular outpatient neurologist.  Discussed with the primary hospitalist.

## 2015-05-19 NOTE — Clinical Social Work Placement (Signed)
   CLINICAL SOCIAL WORK PLACEMENT  NOTE  Date:  05/19/2015  Patient Details  Name: Dona Klemann MRN: [id number] Date of Birth: 04/20/1960  Clinical Social Work is seeking post-discharge placement for this patient at the Skilled  Nursing Facility level of care (*CSW will initial, date and re-position this form in  chart as items are completed):  Yes   Patient/family provided with [facility] Clinical Social Work Department's list of facilities offering this level of care within the geographic area requested by the patient (or if unable, by the patient's family).  Yes   Patient/family informed of their freedom to choose among providers that offer the needed level of care, that participate in Medicare, Medicaid or managed care program needed by the patient, have an available bed and are willing to accept the patient.  Yes   Patient/family informed of [facility]'s ownership interest in [facility] and [facility], as well as of the fact that they are under no obligation to receive care at these facilities.  PASRR submitted to EDS on       PASRR number received on       Existing PASRR number confirmed on 05/18/15     FL2 transmitted to all facilities in geographic area requested by pt/family on 05/18/15     FL2 transmitted to all facilities within larger geographic area on       Patient informed that his/her managed care company has contracts with or will negotiate with certain facilities, including the following:        Yes   Patient/family informed of bed offers received.  Patient chooses bed at [facility]     Physician recommends and patient chooses bed at      Patient to be transferred to [facility] on 05/19/15.  Patient to be transferred to facility by [facility]     Patient family notified on 05/18/15 of transfer.  Name of family member notified:  Patient will notify his sister     PHYSICIAN Please sign FL2, Please prepare  priority discharge summary, including medications, Please prepare prescriptions     Additional Comment:    _______________________________________________ Darylene Price, LCSW [phone]

## 2015-05-19 NOTE — Progress Notes (Addendum)
CSW spoke with MD re: planned d/c of patient back to SNF today. She stated that patient is not medically stable at this time nor is a d/c date anticipated right now. Discussed that LOG has been arranged with [facility] until patient is moved to the [place] next week and family is aware of above arrangement. Notified RN Liaison and sister Porfirio Mylar of above.  Lorri Frederick. Jaci Lazier, LCSW [phone] (coverage)  Addendum. SW received call from Dr. Cathlean Cower. DC plan has changed and now he will be d/c'd this afternoon to [facility]. CSW notified facility, sister and will arrange EMS once DC summary is completed.  Patient notified of d/c plan. RN to call report to facility.    No further CSW needs identified.  Lorri Frederick. Taraji Mungo, LCSW [phone](Coverage)

## 2015-05-19 NOTE — Progress Notes (Signed)
Family Medicine Teaching Service Daily Progress Note Intern Pager: [phone]  Patient name: Donald Marks Medical record number: [id number] Date of birth: March 12, 1961 Age: 55 y.o. Gender: male  Primary Care Provider: Sondra Come, MD Consultants: Neurology  Code Status: Full   Pt Overview and Major Events to Date:   Assessment and Plan:  Donald Marks is a 55 y.o. male presenting with . PMH is significant for diabetes, hypertension, CAD, recent MI with stent placement, seizures and possible Pancreatic cancer.   Altered Mental Status following Seizure: Patient without issues this morning.  Left hemiparesis on exam similar to previous neurologic evaluations done 2/14. Patient with recent admission for Acute CVA on 2/9; following discharge today, patient had a seizure and was not verbally responsive for 3 hours upon arrival to ED. Patient is now verbally responsive, though confused; seeming back to baseline post discharge today. CT Head was negative for hemorrhagic transformation. MRI negative for any acute intercranial process. EEG negative for any continued seizure like activity.  - Neuro; camera EEG per neuro  - Neurology d/c Lamictal 150 mg BID for long-term video EEG   - Neuro checks every 2 hours  - Seizure precautions  - Vitals per floor   Proteinuria: has had some protein in his urine but now nephrotic range proteinuria. CT scan done in 2008 showed normal sized kidneys. Possible to be related to his uncontrolled diabetes.  - microalbumin/creatinine ratio elevated  - will start an ACE  - consider outpatient workup.    Recent CVA on 2/8: L hemiparesis since night of 2/8. MRI with acute CVA of right putamen and caudate - Transitioned to Plavix after failure on aspirin  -  Atrovostatin 80 mg   Seizure Disorder: hx of seizures since childhood. Antiepileptic changed to Lamictal a few weeks ago. Patient with hx of seizure during recent hospitalization  - Lamictal 150 mg BID  being held right now for camera EEG  - Ativan 2 mg PRN for seizures lasting > 5 mins  Hypertension: BP 175/89 - Will start Norvasc 10 mg - Consider adding lisinopril 10 mg  - BMET tomorrow; Cr 1.59  - Hydralazine PRN for BP >180/110  Diabetes Mellitus: Home on Lantus 15-18 units qhs, and Humalog 1-10 units TID. A1C 10.5.  - Lantus 10 units qhs - Moderate SSI, HS - CBGs  CAD: with hx of having triple heart bypass years ago and an MI on 04/29/15  FEN/GI: SLIV, heart healthy carb modified diet Prophylaxis: Lovenox  [facility]; Social work consulted.   Subjective:  Patient doing well this morning. Denies ever having issues while on lisinopril before.  Denies chest pain, nausea/vomitting, chest pain, SOB   Objective: Temp:  [97.8 F (36.6 C)-98.4 F (36.9 C)] 98.4 F (36.9 C) (02/17 0559) Pulse Rate:  [71-80] 74 (02/17 0713) Resp:  [18-20] 20 (02/17 0559) BP: (158-184)/(75-99) 175/89 mmHg (02/17 0713) SpO2:  [96 %-100 %] 96 % (02/17 0713) Physical Exam: General: Lying in bed, NAD  Cardiovascular: RRR, no murmurs or gallops  Respiratory: CTAB, no wheezing, rhonchi  Abdomen: BS+, no ttp, no rebound  Extremities: no lower extremity edema.   Laboratory:  Recent Labs Lab 05/16/15 2333 05/17/15 0504 05/18/15 0810  WBC 9.6 9.4 8.4  HGB 12.2* 11.7* 11.5*  HCT 37.9* 36.3* 36.0*  PLT 357 341 351    Recent Labs Lab 05/16/15 2333 05/17/15 0504 05/18/15 0810 05/19/15 0612  NA 143 142 142 142  K 3.8 3.7 3.7 3.6  CL 109 109 111 109  CO2 21* BUN CREATININE 2.02* 1.89* 1.47* 1.53*  CALCIUM 9.3 8.8* 8.9 9.1  PROT 6.9  --   --   --   BILITOT 0.5  --   --   --   ALKPHOS 111  --   --   --   ALT 27  --   --   --   AST 33  --   --   --   GLUCOSE 135* 128* 120* 102*    Imaging/Diagnostic Tests: No results found.   Donald Mayra Reel, MD 05/19/2015, 9:40 AM PGY-1, [facility] Family Medicine FPTS Intern pager: [phone], text pages  welcome

## 2015-05-19 NOTE — Progress Notes (Signed)
Pt transported via stretcher out to facility by [facility].  All belongings sent with patient.  Condom cath removed.

## 2015-05-19 NOTE — Progress Notes (Signed)
Pt transferred to: [facility] Anticipated date of transfer: 05/19/15 Transported by: Ambulance ([facility]) Time Tentatively Scheduled for: 5:30 PM Family notified: Sister was aware of SNF placement; patient will call her re: change of facility. Report # given to nursing to call report and d/c summary obtained by facility.  Patient was scheduled to return to [facility]- however, after his sister spoke with admissions at the facility- it was felt that the SNF would not be able to truly meet her expectations of care and thus declined patient. After careful consideration- [facility].  Discussed in detail with patient;  He verbalized understanding and was agreeable to d/c to this facility to await d/c to the [place] on Tuesday.  Patient placed with Letter of Guarantee.  He was noted to be alert, oriented and very pleasant. He agreed to notify his sister of change in placement.  He asked many appropriate questions about [facility] and indicated that he was pleased with change in SNF as he did not feel that his family had a positive experience at the other SNF.  He denied any concerns or questions. No further CSW needs identified.     CSW signing off.  Lovette Cliche, LCSW [phone]

## 2015-05-19 NOTE — Procedures (Signed)
  Electroencephalogram report- LTM   Ordering Physician : DR Lavon Paganini  Data acquisition: 10-20 electrode placement.  Additional T1, T2, and EKG electrodes; 26 channel digital referential acquisition reformatted to 18 channel/7 channel coronal bipolar    Beginning time: 05/18/15 Ending time: 05/19/15  Day of study: day 1     This 22 hours of intensive EEG monitoring with simultaneous video monitoring was performed for this patient with spells of shaking  as a part of ongoing series to capture events of interest and determine if these are seizures.     There was no pushbutton activations events during this recording.    Waking background activities were marked by 8-9 cps posterior dominant symmetric synchronized alpha rhythm which tends to attenuate with eyes opening.  Patient had normal drowsiness and sleep architecture.  There was no epileptiform discharges clinical or subclinical seizures present.    Clinical interpretation: This 22 hours of intensive EEG monitoring with simultaneous monitoring did not record any clinical or subclinical seizures.  Background activities were normal during wakefulness drowsiness and sleep throughout the recording.    Continuous monitoring is recommended to capture events of interest and determine if these are seizures

## 2015-05-19 NOTE — Discharge Instructions (Signed)
You were admitted for seizures and neurology has worked this up. You will continue your regimen of Lamictal 150 mg twice daily. These follow-up with neurology and with your primary care physician. You will need to increase your blood pressure medications at discharge.

## 2015-05-19 NOTE — Discharge Summary (Signed)
Family Medicine Teaching [facility] Discharge Summary  Patient name: Donald Marks Medical record number: [id number] Date of birth: 1960-08-19 Age: 55 y.o. Gender: male Date of Admission: 05/16/2015  Date of Discharge: 05/19/2015  Admitting Physician: Nestor Ramp, MD  Primary Care Provider: Sondra Come, MD Consultants: Neurology   Indication for Hospitalization: Seizure  Discharge Diagnoses/Problem List:  T2DM HTN CAD  Recent MI  Seizure Possible Pancreatic Cancer   Disposition: Good  Discharge Condition: Stable   Discharge Exam: General: NAD, lying in bed, very sleepy  Cardiovascular: RRR, no murmurs, rubs, gallops  Respiratory:CTAB, no rhonchi; limited due to patient compliance with commands Abdomen: BS+, no ttp, non distended, no rebound or rigidity  Neuro: Decreased strength in upper and lower left extremity 4/5, compared to right upper/lower extremity   Brief Hospital Course:  Patient presenting in a postictal [facility]following a [facility] at [facility]. Patient was not verbally responsive however became so after 3 hours. She with a recent acute CVA with concern for other deterioration. CT head was negative for hemorrhagic transformation. MRI was completed and was negative for any further acute neurologic findings. Patient underwent an EEG which was negative for any seizure-like activity. However around 6 AM the following morning patient had a another seizure and neurology was consulted. He underwent another EEG followed by a 24 hour long-term video EEG. However patient did not have any further seizures through this monitoring. Neurology dated no need for any further workup and patient to be continued on Lamictal 150 mg twice a day.  During patient's stay, he was found to have elevated blood pressures. Amlodipine was started at 10 mg for better blood pressure control and continued antianginal effects. Lisinopril 10 mg was added as patient is a diabetic and has  hypertension for renal protection. Antihypertensive including Procardia and Lasix were discontinued.   Issues for Follow Up:  1. Please follow up on patient's blood pressure. 2. Patient recently started on lisinopril 10 mg, will need to recheck a basic metabolic panel in about a week 3. Please continue Lamictal 150 mg twice a day 4. Patient with some left- sided hemiparesis due to recent CVA, continue to follow. 5. For seizures greater than 5 minutes, can give rectal diazepam; not do not give more than 5 times in a month 6. Follow-up with neurology 7. Please continue amlodipine 10 mg daily for blood pressure 8. Decreased Lantus from 15 mg to 10 mg per patient's blood sugars during hospitalization, may need to increase once patient acclimates to nursing home.   Significant Procedures: None   Significant Labs and Imaging:   Recent Labs Lab 05/16/15 2333 05/17/15 0504 05/18/15 0810  WBC 9.6 9.4 8.4  HGB 12.2* 11.7* 11.5*  HCT 37.9* 36.3* 36.0*  PLT 357 341 351    Recent Labs Lab 05/14/15 0650 05/16/15 2333 05/17/15 0504 05/18/15 0810 05/19/15 0612  NA 141 143 142 142 142  K 3.6 3.8 3.7 3.7 3.6  CL 109 109 109 111 109  CO2 22 21* 23 24 24   GLUCOSE 119* 135* 128* 120* 102*  BUN 11 17 15 12 13   CREATININE 1.49* 2.02* 1.89* 1.47* 1.53*  CALCIUM 8.9 9.3 8.8* 8.9 9.1  ALKPHOS  --  111  --   --   --   AST  --  33  --   --   --   ALT  --  27  --   --   --   ALBUMIN  --  3.3*  --   --   --  Dg Chest 1 View  05/17/2015  CLINICAL DATA:  55 year old male with mental status change EXAM: CHEST 1 VIEW COMPARISON:  Chest radiograph dated 07/10/2006 FINDINGS: Single view of the chest demonstrates cardiomegaly. Median sternotomy wires and CABG vascular clips noted. There is minimal prominence of the central vasculature may represent mild congestive changes. No focal consolidation, pleural effusion, or pneumothorax. No acute osseous pathology. IMPRESSION: Cardiomegaly with possible  mild congestive changes. No focal consolidation. Electronically Signed   By: Elgie Collard M.D.   On: 05/17/2015 01:02   Ct Head Wo Contrast  05/17/2015  CLINICAL DATA:  55 year old male with altered mental status EXAM: CT HEAD WITHOUT CONTRAST TECHNIQUE: Contiguous axial images were obtained from the base of the skull through the vertex without intravenous contrast. COMPARISON:  MRI dated 05/11/2015 and head CT dated 07/10/2006 FINDINGS: There is no acute intracranial hemorrhage. The ventricles and sulci are appropriate in size for patient's age. Mild periventricular and deep white matter chronic microvascular ischemic changes noted. There is a a small area of infarct and encephalomalacia in the right periventricular white matter and corona radiata extending into the area of right internal capsule. There is no mass effect or midline shift. There is mild mucoperiosteal thickening of the left maxillary sinus. The remainder of the visualized paranasal sinuses and mastoid air cells are clear. The calvarium is intact. IMPRESSION: No acute intracranial hemorrhage. Periventricular and deep white matter chronic microvascular ischemic changes as well as a small focus of subacute/old infarct in the right periventricular white matter and right internal capsule. If symptoms persist and there are no contraindications, MRI may provide better evaluation if clinically indicated Electronically Signed   By: Elgie Collard M.D.   On: 05/17/2015 00:42   Mr Brain Wo Contrast  05/17/2015  CLINICAL DATA:  Breakthrough seizure, history of seizures and remote head injury. History of hypertension, diabetes and stroke. EXAM: MRI HEAD WITHOUT CONTRAST TECHNIQUE: Multiplanar, multiecho pulse sequences of the brain and surrounding structures were obtained without intravenous contrast. COMPARISON:  CT head May 16, 2015 and MRI of the brain May 11, 2015 FINDINGS: Subcentimeter focus of reduced diffusion RIGHT corona radiata to  internal capsule with normalized ADC values, present on prior MRI. No new foci of acute ischemia. Old bilateral basal ganglia and thalamus lacunar infarcts, faint susceptibility associated with RIGHT basal ganglia infarct. No lobar hematoma. Patchy to confluent supratentorial white matter FLAIR T2 hyperintensities are unchanged. No midline shift, mass effect or mass lesions. No abnormal extra-axial fluid collections. Normal major intracranial vascular flow voids present at skull base. Status post LEFT ocular lens implant. Mild LEFT maxillary mucosal thickening. No paranasal sinus air-fluid levels. Mastoid air cells are well aerated. No abnormal sellar expansion. No cerebellar tonsillar ectopia. No suspicious calvarial bone marrow signal. IMPRESSION: No acute intracranial process. Evolving subacute RIGHT corona radiata subcentimeter infarct. Old bilateral basal ganglia and thalamus lacunar infarcts. Moderate chronic small vessel ischemic disease, advanced for age though stable from prior imaging. Electronically Signed   By: Awilda Metro M.D.   On: 05/17/2015 04:06   Dg Knee Complete 4 Views Right  05/16/2015  CLINICAL DATA:  55 year old who fell yesterday while in the shower at home, injuring the right knee. Anterior and lateral pain. Initial encounter. EXAM: RIGHT KNEE - COMPLETE 4+ VIEW COMPARISON:  None. FINDINGS: No evidence of acute fracture or dislocation. Mild medial compartment joint space narrowing. Patellofemoral and lateral compartment joint spaces well preserved. Bone mineral density well-preserved. Circumscribed mixed lucent and sclerotic cortical lesion  arising from the posterior cortex of the proximal tibial metaphysis. No other intrinsic osseous abnormality. Small to moderate-sized joint effusion. Mild prepatellar soft tissue swelling. IMPRESSION: 1. No acute osseous abnormality. 2. Small to moderate-sized joint effusion. 3. 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 4. 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 Electronically Signed   By: Hulan Saas M.D.   On: 05/16/2015 15:26    Results/Tests Pending at Time of Discharge: none  Discharge Medications:    Medication List    STOP taking these medications        furosemide 80 MG tablet  Commonly known as:  LASIX     NIFEdipine 60 MG 24 hr tablet  Commonly known as:  PROCARDIA-XL/ADALAT CC     potassium chloride SA 20 MEQ tablet  Commonly known as:  K-DUR,KLOR-CON      TAKE these medications        amLODipine 10 MG tablet  Commonly known as:  NORVASC  Take 1 tablet (10 mg total) by mouth daily.     atorvastatin 80 MG tablet  Commonly known as:  LIPITOR  Take 80 mg by mouth daily.     clopidogrel 75 MG tablet  Commonly known as:  PLAVIX  Take 1 tablet (75 mg total) by mouth daily.     diazepam 2.5 MG Gel  Commonly known as:  DIASTAT  Place 2.5 mg rectally once. for Seizure lasting longer than 5 minutes. Please do not use more than 5 times in a month.     insulin glargine 100 UNIT/ML injection  Commonly known as:  LANTUS  Inject 0.1 mLs (10 Units total) into the skin at bedtime. Patient uses sliding scale     insulin lispro 100 UNIT/ML injection  Commonly known as:  HUMALOG  Inject 0-10 Units into the skin 3 (three) times daily before meals.     lamoTRIgine 150 MG tablet  Commonly known as:  LAMICTAL  Take 1 tablet (150 mg total) by mouth 2 (two) times daily.     lisinopril 5 MG tablet  Commonly known as:  PRINIVIL,ZESTRIL  Take 1 tablet (5 mg total) by mouth daily.     magnesium oxide 400 MG tablet  Commonly known as:  MAG-OX  Take 400 mg by mouth daily.     Vitamin D3 2000 units capsule  Take 2,000 Units by mouth daily.        Discharge Instructions: Please refer to Patient Instructions section of EMR for full details.  Patient was counseled important signs and symptoms that should prompt return to medical care,  changes in medications, dietary instructions, activity restrictions, and follow up appointments.   Follow-Up Appointments: Follow-up Information    Follow up with [facility].   Specialty:  Skilled Nursing Facility   Contact information:   [address] [place] [address] [phone]      Follow up with [facility], [place]. Schedule an appointment as soon as possible for a visit in 4 days.   Specialty:  Internal Medicine   Contact information:   [address] [place] [address]       Jarmaine Ehrler Mayra Reel, MD 05/19/2015, 2:17 PM PGY-1, [facility] Family Medicine

## 2015-05-21 ENCOUNTER — Emergency Department (HOSPITAL_COMMUNITY): Payer: Non-veteran care

## 2015-05-21 ENCOUNTER — Encounter (HOSPITAL_COMMUNITY): Payer: Self-pay

## 2015-05-21 ENCOUNTER — Emergency Department (HOSPITAL_COMMUNITY)
Admission: EM | Admit: 2015-05-21 | Discharge: 2015-05-21 | Disposition: A | Discharge status: Skilled Nursing Facility | Payer: Non-veteran care | Attending: Emergency Medicine | Admitting: Emergency Medicine

## 2015-05-21 DIAGNOSIS — I252 Old myocardial infarction: Secondary | ICD-10-CM | POA: Insufficient documentation

## 2015-05-21 DIAGNOSIS — Z9889 Other specified postprocedural states: Secondary | ICD-10-CM | POA: Diagnosis not present

## 2015-05-21 DIAGNOSIS — Z8673 Personal history of transient ischemic attack (TIA), and cerebral infarction without residual deficits: Secondary | ICD-10-CM | POA: Diagnosis not present

## 2015-05-21 DIAGNOSIS — I10 Essential (primary) hypertension: Secondary | ICD-10-CM | POA: Diagnosis not present

## 2015-05-21 DIAGNOSIS — I2510 Atherosclerotic heart disease of native coronary artery without angina pectoris: Secondary | ICD-10-CM | POA: Diagnosis not present

## 2015-05-21 DIAGNOSIS — R569 Unspecified convulsions: Secondary | ICD-10-CM | POA: Diagnosis not present

## 2015-05-21 DIAGNOSIS — Z794 Long term (current) use of insulin: Secondary | ICD-10-CM | POA: Insufficient documentation

## 2015-05-21 DIAGNOSIS — Z7902 Long term (current) use of antithrombotics/antiplatelets: Secondary | ICD-10-CM | POA: Diagnosis not present

## 2015-05-21 DIAGNOSIS — E119 Type 2 diabetes mellitus without complications: Secondary | ICD-10-CM | POA: Insufficient documentation

## 2015-05-21 DIAGNOSIS — Z951 Presence of aortocoronary bypass graft: Secondary | ICD-10-CM | POA: Insufficient documentation

## 2015-05-21 DIAGNOSIS — Z79899 Other long term (current) drug therapy: Secondary | ICD-10-CM | POA: Insufficient documentation

## 2015-05-21 DIAGNOSIS — F445 Conversion disorder with seizures or convulsions: Secondary | ICD-10-CM | POA: Diagnosis not present

## 2015-05-21 LAB — CBC WITH DIFFERENTIAL/PLATELET
Basophils Absolute: 0.1 10*3/uL (ref 0.0–0.1)
Basophils Relative: 1 %
EOS ABS: 0.8 10*3/uL — AB (ref 0.0–0.7)
EOS PCT: 9 %
HCT: 35.7 % — ABNORMAL LOW (ref 39.0–52.0)
HEMOGLOBIN: 11.2 g/dL — AB (ref 13.0–17.0)
LYMPHS ABS: 3 10*3/uL (ref 0.7–4.0)
Lymphocytes Relative: 32 %
MCH: 27.8 pg (ref 26.0–34.0)
MCHC: 31.4 g/dL (ref 30.0–36.0)
MCV: 88.6 fL (ref 78.0–100.0)
MONOS PCT: 9 %
Monocytes Absolute: 0.8 10*3/uL (ref 0.1–1.0)
Neutro Abs: 4.8 10*3/uL (ref 1.7–7.7)
Neutrophils Relative %: 49 %
PLATELETS: 386 10*3/uL (ref 150–400)
RBC: 4.03 MIL/uL — ABNORMAL LOW (ref 4.22–5.81)
RDW: 13.6 % (ref 11.5–15.5)
WBC: 9.6 10*3/uL (ref 4.0–10.5)

## 2015-05-21 LAB — CBG MONITORING, ED
Glucose-Capillary: 106 mg/dL — ABNORMAL HIGH (ref 65–99)
Glucose-Capillary: 96 mg/dL (ref 65–99)

## 2015-05-21 LAB — BASIC METABOLIC PANEL
Anion gap: 11 (ref 5–15)
BUN: 14 mg/dL (ref 6–20)
CHLORIDE: 109 mmol/L (ref 101–111)
CO2: 22 mmol/L (ref 22–32)
CREATININE: 1.71 mg/dL — AB (ref 0.61–1.24)
Calcium: 9.2 mg/dL (ref 8.9–10.3)
GFR calc Af Amer: 51 mL/min — ABNORMAL LOW (ref >60)
GFR calc non Af Amer: 44 mL/min — ABNORMAL LOW (ref >60)
GLUCOSE: 104 mg/dL — AB (ref 65–99)
Potassium: 4 mmol/L (ref 3.5–5.1)
Sodium: 142 mmol/L (ref 135–145)

## 2015-05-21 MED ORDER — LORAZEPAM 2 MG/ML IJ SOLN
INTRAMUSCULAR | Status: AC
  Administered 2015-05-21: 1 mg via INTRAVENOUS
  Filled 2015-05-21: qty 1

## 2015-05-21 MED ORDER — LORAZEPAM 2 MG/ML IJ SOLN
1.0000 mg | Freq: Once | INTRAMUSCULAR | Status: AC
  Administered 2015-05-21: 1 mg via INTRAVENOUS

## 2015-05-21 MED ORDER — AMMONIA AROMATIC IN INHA
0.3000 mL | Freq: Once | RESPIRATORY_TRACT | Status: AC
  Administered 2015-05-21: 0.3 mL via RESPIRATORY_TRACT

## 2015-05-21 NOTE — ED Notes (Signed)
[facility] [employer] [facility] [employer] [facility] returns call and states pt is normally walking, takling. A&O x 4. States he has been at their facility since 05/19/15 when he was released from [facility]. Same reported to Dr. Broadus John

## 2015-05-21 NOTE — ED Notes (Signed)
Introduced myself to pt he was able to say name and nod head yes and no to questions no sz activity at this time

## 2015-05-21 NOTE — Consult Note (Signed)
Requesting Physician: Dr.  Clarice Pole    Reason for consultation: ? Seizure like spells,  HPI:                                                                                                                                         Donald Marks is an 55 y.o. male patient who presented from assisted living facility after reported having seizure-like spell in the facility earlier today. After he was brought into the ER he had a spell. The spells lasted apparently for 5 minutes each time. Patient is known to have spells occurring on a daily basis as described in detail in previousrecent hospitalization of this week. He had a 22 hours of prolonged video EEG done during the recent hospitalization to capture any spells. All the patient reported he has frequent spells on a daily basis he did not have any spells during the EEG. In the past he had a similar long-term EEG monitoring done at the [facility] in [place], , was admitted for 5 days and did not have any spells during that time. He is on long-term treatment with lamotrigine currently taking 150 minutes twice a day.  During my evaluation,   patient was unresponsive to verbal commands or tactile stimulation and was staring straight ahead with no gaze deviation noted. 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 With use of respiratory stimulant ( 0.58ml ammonia ) he responded immediately and followed commands.   His sister was present at bedside. She denied any knowledge of significant stressors.      Past Medical History: Past Medical History  Diagnosis Date  . MI (myocardial infarction) (HCC)   . Diabetes mellitus without complication (HCC)   . Hypertension   . Coronary artery disease   . Seizure (HCC)   . CVA (cerebral infarction)   . CVA (cerebral vascular accident) (HCC) 05/2015    Past Surgical History  Procedure Laterality Date  . Coronary artery bypass graft    . Cardiac catheterization    . Cholecystectomy    . Mouth surgery      Family  History: Family History  Problem Relation Age of Onset  . Hyperlipidemia Mother   . Hypertension Father   . Hypertension Mother   . Hyperlipidemia Father   . Aneurysm Mother   . Aneurysm Maternal Grandmother   . Hypertension Maternal Grandmother   . Diabetes Father     Social History:   reports that he has never smoked. He has never used smokeless tobacco. He reports that he does not drink alcohol or use illicit drugs.  Allergies:  Allergies  Allergen Reactions  . Contrast Media [Iodinated Diagnostic Agents]      Medications:  Current facility-administered medications:  .  ammonia inhalant 0.3 mL, 0.3 mL, Inhalation, Once, Derwood Kaplan, MD  Current outpatient prescriptions:  .  amLODipine (NORVASC) 10 MG tablet, Take 1 tablet (10 mg total) by mouth daily., Disp: , Rfl: 0 .  atorvastatin (LIPITOR) 80 MG tablet, Take 80 mg by mouth daily., Disp: , Rfl:  .  Cholecalciferol (VITAMIN D3) 2000 units capsule, Take 2,000 Units by mouth daily., Disp: , Rfl:  .  clopidogrel (PLAVIX) 75 MG tablet, Take 1 tablet (75 mg total) by mouth daily., Disp: 30 tablet, Rfl: 3 .  diazepam (DIASTAT) 2.5 MG GEL, Place 2.5 mg rectally once. for Seizure lasting longer than 5 minutes. Please do not use more than 5 times in a month., Disp: 5 Package, Rfl: 0 .  insulin glargine (LANTUS) 100 UNIT/ML injection, Inject 0.1 mLs (10 Units total) into the skin at bedtime. Patient uses sliding scale, Disp: 10 mL, Rfl:  .  insulin lispro (HUMALOG) 100 UNIT/ML injection, Inject 0-10 Units into the skin 3 (three) times daily before meals. , Disp: , Rfl:  .  lamoTRIgine (LAMICTAL) 150 MG tablet, Take 1 tablet (150 mg total) by mouth 2 (two) times daily., Disp: 60 tablet, Rfl:  .  lisinopril (PRINIVIL,ZESTRIL) 5 MG tablet, Take 1 tablet (5 mg total) by mouth daily., Disp: 30 tablet, Rfl: 0 .   magnesium oxide (MAG-OX) 400 MG tablet, Take 400 mg by mouth daily., Disp: , Rfl:    ROS:                                                                                                                                       History unobtainable from patient due to lack of cooperation   Neurologic Examination:                                                                                                      Blood pressure 194/91, pulse 70, temperature 98.2 F (36.8 C), temperature source Oral, resp. rate 16, height 6\' 2"  (1.88 m), weight 108.863 kg (240 lb), SpO2 100 %. Limited neurologic examination due to patient's lack of cooperation . During my evaluation, patient was unresponsive to verbal commands or tactile stimulation and was staring straight ahead with no gaze deviation noted. 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 With use of respiratory stimulant ( 0.81ml ammonia ) he responded immediately and followed commands.  At that point he moved all extremities symmetrically, and purposefully, normal full eom, symmetric facial  movements.   Lab Results: Basic Metabolic Panel:  Recent Labs Lab 05/16/15 2333 05/17/15 0504 05/18/15 0810 05/19/15 0612 05/21/15 1045  NA 143 142 142 142 142  K 3.8 3.7 3.7 3.6 4.0  CL 109 109 111 109 109  CO2 21* GLUCOSE 135* 128* 120* 102* 104*  BUN CREATININE 2.02* 1.89* 1.47* 1.53* 1.71*  CALCIUM 9.3 8.8* 8.9 9.1 9.2    Liver Function Tests:  Recent Labs Lab 05/16/15 2333  AST 33  ALT 27  ALKPHOS 111  BILITOT 0.5  PROT 6.9  ALBUMIN 3.3*   No results for input(s): LIPASE, AMYLASE in the last 168 hours. No results for input(s): AMMONIA in the last 168 hours.  CBC:  Recent Labs Lab 05/16/15 2333 05/17/15 0504 05/18/15 0810 05/21/15 1045  WBC 9.6 9.4 8.4 9.6  NEUTROABS 5.1  --   --  4.8  HGB 12.2* 11.7* 11.5* 11.2*  HCT 37.9* 36.3* 36.0* 35.7*  MCV 88.8 89.9 87.6 88.6  PLT 357 341 351 386    Cardiac  Enzymes: No results for input(s): CKTOTAL, CKMB, CKMBINDEX, TROPONINI in the last 168 hours.  Lipid Panel: No results for input(s): CHOL, TRIG, HDL, CHOLHDL, VLDL, LDLCALC in the last 168 hours.  CBG:  Recent Labs Lab 05/19/15 0752 05/19/15 1216 05/19/15 1742 05/21/15 0843 05/21/15 0916  GLUCAP 83 107* 132* 96 106*    Microbiology: Results for orders placed or performed during the hospital encounter of 05/16/15  MRSA PCR Screening     Status: Abnormal   Collection Time: 05/17/15  7:59 AM  Result Value Ref Range Status   MRSA by PCR POSITIVE (A) NEGATIVE Final    Comment:        The GeneXpert MRSA Assay (FDA approved for NASAL specimens only), is one component of a comprehensive MRSA colonization surveillance program. It is not intended to diagnose MRSA infection nor to guide or monitor treatment for MRSA infections. RESULT CALLED TO, READ BACK BY AND VERIFIED WITH: Karrie Doffing RN 12:10 05/17/15 ([email or web address])      Imaging: Dg Chest Port 1 View  05/21/2015  CLINICAL DATA:  Seizure today EXAM: PORTABLE CHEST 1 VIEW COMPARISON:  05/16/2015 FINDINGS: Upper normal heart size. Low volumes. Mild basilar atelectasis. No pneumothorax. IMPRESSION: Bibasilar atelectasis. Electronically Signed   By: Jolaine Click M.D.   On: 05/21/2015 10:39    Assessment and plan:   Donald Marks is an 55 y.o. male patient who has history of multiple seizure-like spells, occurring on a daily basis as described in my recent notes from hospitalization earlier this week. He had a prolonged 20 to our EEG done during the hospital admission which was completely normal, with no evidence of abnormal epileptiform discharges or seizures noted. although patient has daily spells, he did not have a spell like he was on the EEG monitoring. 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 He had 2 of such spells earlier today, one of them in the ER. During my evaluation today, he  appeared unresponsive initially, but immediately responded with the use of ammonia inhalant respiratory stimulant.  At this point, no further neurodiagnostic workup or any new medications are recommended. Continue his prior home medications lamotrigine 150 twice a day, which is a good mood stabilizer. I discussed in detail with his sister about the nonepileptic nature of his spells. I recommend follow-up with a psychiatrist and a counselor at [facility]. Patient is a Cytogeneticist.  Discussed with ER physician, Dr. Clarice Pole.

## 2015-05-21 NOTE — ED Notes (Signed)
[facility] contacted for further patient information.

## 2015-05-21 NOTE — ED Notes (Signed)
Called to room by sister pt seizing with upper body shaking dr  Trude Mcburney at bedside

## 2015-05-21 NOTE — ED Provider Notes (Signed)
CSN: [id number]     Arrival date & time 05/21/15  4098 History   First MD Initiated Contact with Patient 05/21/15 [phone]     Chief Complaint  Patient presents with  . Seizures     (Consider location/radiation/quality/duration/timing/severity/associated sxs/prior Treatment) HPI Reportedly patient had seizure activity at his nursing home facility. They reported general shaking but there were limited details on duration of symptoms. Since that time the patient has been somnolent and not at baseline which is generally conversant and with some independent mobility. Patient is in rehabilitation at the nursing facility status post CVA with plan to transfer for longer-term rehabilitation at the [facility] facility. The patient is not giving any additional history. The patient's sister later arrived and reported that he is supposed to be getting lamotrigine twice daily. She was unsure if that had actually been administered. Past Medical History  Diagnosis Date  . MI (myocardial infarction) (HCC)   . Diabetes mellitus without complication (HCC)   . Hypertension   . Coronary artery disease   . Seizure (HCC)   . CVA (cerebral infarction)   . CVA (cerebral vascular accident) (HCC) 05/2015   Past Surgical History  Procedure Laterality Date  . Coronary artery bypass graft    . Cardiac catheterization    . Cholecystectomy    . Mouth surgery     Family History  Problem Relation Age of Onset  . Hyperlipidemia Mother   . Hypertension Father   . Hypertension Mother   . Hyperlipidemia Father   . Aneurysm Mother   . Aneurysm Maternal Grandmother   . Hypertension Maternal Grandmother   . Diabetes Father    Social History  Substance Use Topics  . Smoking status: Never Smoker   . Smokeless tobacco: Never Used  . Alcohol Use: No    Review of Systems  Cannot obtain review systems due to patient condition, somnolent level V caveat.  Allergies  Contrast media  Home Medications   Prior to Admission  medications   Medication Sig Start Date End Date Taking? Authorizing Provider  amLODipine (NORVASC) 10 MG tablet Take 1 tablet (10 mg total) by mouth daily. 05/19/15   [name], MD  atorvastatin (LIPITOR) 80 MG tablet Take 80 mg by mouth daily.    Historical Provider, MD  Cholecalciferol (VITAMIN D3) 2000 units capsule Take 2,000 Units by mouth daily.    Historical Provider, MD  clopidogrel (PLAVIX) 75 MG tablet Take 1 tablet (75 mg total) by mouth daily. 05/16/15   Beaulah Dinning, MD  diazepam (DIASTAT) 2.5 MG GEL Place 2.5 mg rectally once. for Seizure lasting longer than 5 minutes. Please do not use more than 5 times in a month. 05/19/15   [name], MD  insulin glargine (LANTUS) 100 UNIT/ML injection Inject 0.1 mLs (10 Units total) into the skin at bedtime. Patient uses sliding scale 05/19/15   [name], MD  insulin lispro (HUMALOG) 100 UNIT/ML injection Inject 0-10 Units into the skin 3 (three) times daily before meals.     Historical Provider, MD  lamoTRIgine (LAMICTAL) 150 MG tablet Take 1 tablet (150 mg total) by mouth 2 (two) times daily. 05/19/15   [name], MD  lisinopril (PRINIVIL,ZESTRIL) 5 MG tablet Take 1 tablet (5 mg total) by mouth daily. 05/19/15   [name], MD  magnesium oxide (MAG-OX) 400 MG tablet Take 400 mg by mouth daily.    Historical Provider, MD   BP 190/95 mmHg  Pulse 70  Temp(Src) 98.2 F (36.8 C) (Oral)  Resp 16  Ht  (1.88 m)  Wt 240 lb (108.863 kg)  BMI 30.80 kg/m2  SpO2 100% Physical Exam  Constitutional:  Patient is somewhat somnolent but he awakens and makes slight verbal response. He is not making formed verbal responses. Respiratory distress. No pallor or diaphoresis.  HENT:  Head: Normocephalic and atraumatic.  Mouth/Throat: Oropharynx is clear and moist.  Eyes: EOM are normal. Pupils are equal, round, and reactive to light.  Neck: Neck supple.  Cardiovascular: Normal rate, regular rhythm,  normal heart sounds and intact distal pulses.   Pulmonary/Chest: Effort normal and breath sounds normal.  Abdominal: Soft. He exhibits no distension. There is no tenderness.  Musculoskeletal: He exhibits no edema or tenderness.  Neurological:  Patient is somnolent but makes slight response with some verbal noises. Fairly unintelligible. He will follow some commands to make a response to grip strength bilaterally. He is not vigorous and these responses. He can also independently move the lower extremities. But again, limited in performing full for strength testing or motor testing.  Skin: Skin is warm and dry.  Psychiatric:  Patient is calm    ED Course  Procedures (including critical care time) Labs Review Labs Reviewed  BASIC METABOLIC PANEL - Abnormal; Notable for the following:    Glucose, Bld 104 (*)    Creatinine, Ser 1.71 (*)    GFR calc non Af Amer 44 (*)    GFR calc Af Amer 51 (*)    All other components within normal limits  CBC WITH DIFFERENTIAL/PLATELET - Abnormal; Notable for the following:    RBC 4.03 (*)    Hemoglobin 11.2 (*)    HCT 35.7 (*)    Eosinophils Absolute 0.8 (*)    All other components within normal limits  CBG MONITORING, ED - Abnormal; Notable for the following:    Glucose-Capillary 106 (*)    All other components within normal limits  LAMOTRIGINE LEVEL  CBG MONITORING, ED    Imaging Review Dg Chest Port 1 View  05/21/2015  CLINICAL DATA:  Seizure today EXAM: PORTABLE CHEST 1 VIEW COMPARISON:  05/16/2015 FINDINGS: Upper normal heart size. Low volumes. Mild basilar atelectasis. No pneumothorax. IMPRESSION: Bibasilar atelectasis. Electronically Signed   By: Jolaine Click M.D.   On: 05/21/2015 10:39   I have personally reviewed and evaluated these images and lab results as part of my medical decision-making.   EKG Interpretation None     Dr. Lavon Paganini has been consult it. Dr. Lavon Paganini was familiar with the patient as he had managed his case 2 days  ago. He advised that the patient has not shown evidence of seizure activity with prolonged EEG monitoring. 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 Patient has awakened with use of ammonia capsule. Plan will be for discharge back to nursing care. MDM   Final diagnoses:  Pseudoseizure (HCC)   As per outlined, patient had recent hospitalization with metal status change. The patient has been identified to have pseudoseizure. Diagnostic workup is unremarkable.     Arby Barrette, MD 05/21/15 1426

## 2015-05-21 NOTE — ED Notes (Signed)
Pt remains with minimal verbal response. Airway intact. Skin warm and dry with stable VS.

## 2015-05-21 NOTE — ED Notes (Signed)
MD at bedside. 

## 2015-05-21 NOTE — Discharge Instructions (Signed)
Nonepileptic Seizures °Nonepileptic seizures are seizures that are not caused by abnormal electrical signals in your brain. These seizures often seem like epileptic seizures, but they are not caused by epilepsy.  °There are two types of nonepileptic seizures: °· A physiologic nonepileptic seizure results from a disruption in your brain. °· A psychogenic seizure results from emotional stress. These seizures are sometimes called pseudoseizures. °CAUSES  °Causes of physiologic nonepileptic seizures include:  °· Sudden drop in blood pressure. °· Low blood sugar. °· Low levels of salt (sodium) in your blood. °· Low levels of calcium in your blood. °· Migraine. °· Heart rhythm problems. °· Sleep disorders. °· Drug and alcohol abuse. °Common causes of psychogenic nonepileptic seizures include: °· Stress. °· Emotional trauma. °· Sexual or physical abuse. °· Major life events, such as divorce or the death of a loved one. °· Mental health disorders, including panic attack and hyperactivity disorder. °SIGNS AND SYMPTOMS °A nonepileptic seizure can look like an epileptic seizure, including uncontrollable shaking (convulsions), or changes in attention, behavior, or the ability to remain awake and alert. However, there are some differences. Nonepileptic seizures usually: °· Do not cause physical injuries. °· Start slowly. °· Include crying or shrieking. °· Last longer than 2 minutes. °· Have a short recovery time without headache or exhaustion. °DIAGNOSIS  °Your health care provider can usually diagnose nonepileptic seizures after taking your medical history and giving you a physical exam. Your health care provider may want to talk to your friends or relatives who have seen you have a seizure.  °You may also need to have tests to look for causes of physiologic nonepileptic seizures. This may include an electroencephalogram (EEG), which is a test that measures electrical activity in your brain. If you have had an epileptic  seizure, the results of your EEG will be abnormal. If your health care provider thinks you have had a psychogenic nonepileptic seizure, you may need to see a mental health specialist for an evaluation. °TREATMENT  °Treatment depends on the type and cause of your seizures. °· For physiologic nonepileptic seizures, treatment is aimed at addressing the underlying condition that caused the seizures. These seizures usually stop when the underlying condition is properly treated. °· Nonepileptic seizures do not respond to the seizure medicines used to treat epilepsy. °· For psychogenic seizures, you may need to work with a mental health specialist. °HOME CARE INSTRUCTIONS °Home care will depend on the type of nonepileptic seizures you have.  °· Follow all your health care provider's instructions. °· Keep all your follow-up appointments. °SEEK MEDICAL CARE IF: °You continue to have seizures after treatment. °SEEK IMMEDIATE MEDICAL CARE IF: °· Your seizures change or become more frequent. °· You injure yourself during a seizure. °· You have one seizure after another. °· You have trouble recovering from a seizure. °· You have chest pain or trouble breathing. °MAKE SURE YOU: °· Understand these instructions. °· Will watch your condition. °· Will get help right away if you are not doing well or get worse. °  °This information is not intended to replace advice given to you by your health care provider. Make sure you discuss any questions you have with your health care provider. °  °Document Released: 05/03/2005 Document Revised: 04/08/2014 Document Reviewed: 01/12/2013 °Elsevier Interactive Patient Education ©2016 [employer] ° °

## 2015-05-21 NOTE — ED Notes (Signed)
Pt EMS with c/o seizure at nursing home with hx of siezure. NH had no medication for Patients seizure and requested transport. NH reports seizure lasted 15 minutes. EMS reports seizure in route lasting 15 seconds. Pt will open eyes to verbal stimulus and trys to speak but then closes eyes.

## 2015-05-22 ENCOUNTER — Encounter: Payer: Self-pay | Admitting: Adult Health

## 2015-05-22 ENCOUNTER — Non-Acute Institutional Stay (SKILLED_NURSING_FACILITY): Payer: Self-pay | Admitting: Adult Health

## 2015-05-22 DIAGNOSIS — E118 Type 2 diabetes mellitus with unspecified complications: Secondary | ICD-10-CM

## 2015-05-22 DIAGNOSIS — E1165 Type 2 diabetes mellitus with hyperglycemia: Secondary | ICD-10-CM

## 2015-05-22 DIAGNOSIS — I10 Essential (primary) hypertension: Secondary | ICD-10-CM

## 2015-05-22 DIAGNOSIS — I639 Cerebral infarction, unspecified: Secondary | ICD-10-CM

## 2015-05-22 DIAGNOSIS — Z794 Long term (current) use of insulin: Secondary | ICD-10-CM

## 2015-05-22 DIAGNOSIS — IMO0002 Reserved for concepts with insufficient information to code with codable children: Secondary | ICD-10-CM

## 2015-05-22 DIAGNOSIS — R569 Unspecified convulsions: Secondary | ICD-10-CM

## 2015-05-22 NOTE — Progress Notes (Signed)
Patient ID: Donald Marks, male   DOB: 1960-12-06, 55 y.o.   MRN: [id number]   Facility: [facility]       Allergies  Allergen Reactions  . Contrast Media [Iodinated Diagnostic Agents]     Chief Complaint  Patient presents with  . Discharge Note    Discharge from facility    HPI:  He is being discharged to the [facility] in [place]. He had been hospitalized fo seizure activity; hypertension and diabetes. The will not need dme; will not need prescriptions to be written. He will follow up medically with the provider at the receiving facility.   Past Medical History  Diagnosis Date  . MI (myocardial infarction) (HCC)   . Diabetes mellitus without complication (HCC)   . Hypertension   . Coronary artery disease   . Seizure (HCC)   . CVA (cerebral infarction)   . CVA (cerebral vascular accident) (HCC) 05/2015    Past Surgical History  Procedure Laterality Date  . Coronary artery bypass graft    . Cardiac catheterization    . Cholecystectomy    . Mouth surgery      VITAL SIGNS BP 172/72 mmHg  Pulse 92  Temp(Src) 100 F (37.8 C) (Oral)  Resp 20  Patient's Medications  New Prescriptions   No medications on file  Previous Medications   AMLODIPINE (NORVASC) 10 MG TABLET    Take 1 tablet (10 mg total) by mouth daily.   ATORVASTATIN (LIPITOR) 80 MG TABLET    Take 80 mg by mouth daily.   CHOLECALCIFEROL (VITAMIN D3) 2000 UNITS CAPSULE    Take 2,000 Units by mouth daily.   CLOPIDOGREL (PLAVIX) 75 MG TABLET    Take 1 tablet (75 mg total) by mouth daily.   DIAZEPAM (DIASTAT) 2.5 MG GEL    Place 2.5 mg rectally once. for Seizure lasting longer than 5 minutes. Please do not use more than 5 times in a month.   INSULIN GLARGINE (LANTUS) 100 UNIT/ML INJECTION    Inject 0.1 mLs (10 Units total) into the skin at bedtime. Patient uses sliding scale   INSULIN LISPRO (HUMALOG) 100 UNIT/ML INJECTION    Inject 0-10 Units into the skin 3 (three) times daily before meals.    LAMOTRIGINE  (LAMICTAL) 150 MG TABLET    Take 1 tablet (150 mg total) by mouth 2 (two) times daily.   LISINOPRIL (PRINIVIL,ZESTRIL) 5 MG TABLET    Take 1 tablet (5 mg total) by mouth daily.   MAGNESIUM OXIDE (MAG-OX) 400 MG TABLET    Take 400 mg by mouth daily.  Modified Medications   No medications on file  Discontinued Medications   No medications on file     SIGNIFICANT DIAGNOSTIC EXAMS  05-14-15: 2-d echo: - Left ventricle: There was mild concentric hypertrophy. Systolic function was normal. The estimated ejection fraction was in the range of 60% to 65%. Although no diagnostic regional wall motion abnormality was identified, this possibility cannot be completely excluded on the basis of this study. Features are consistent with a pseudonormal left ventricular filling pattern, with concomitant abnormal relaxation and increased filling pressure (grade   diastolic dysfunction). - Left atrium: The atrium was severely dilated.  05-16-15: right knee x-ray: 1. No acute osseous abnormality. 2. Small to moderate-sized joint effusion. 3. 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 4. 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 Periventricular and deep white matter chronic microvascular ischemic  changes as well as a small focus of subacute/old infarct in the right periventricular white matter and right internal capsule.   05-17-15: EEG: This predominantly drowsy and asleep EEG is normal.    05-17-15: chest x-ray: Cardiomegaly with possible mild congestive changes. No focal consolidation  05-17-15: MRI of brain: No acute intracranial process. Evolving subacute RIGHT corona radiata subcentimeter infarct.  Old bilateral basal ganglia and thalamus lacunar infarcts. Moderate chronic small vessel ischemic disease, advanced for age though stable from prior imaging.  05-21-15; chest x-ray:  Bibasilar atelectasis  05-22-15: right knee x-ray: joint effusion; no acute fracture    LABS REVIEWED:   05-11-15: wbc 7.5; hb 12.;1 hct 36.6; mcv 88.0; plt 325; glucose 304; bun 20; creat 2.11; k+ 4.2; na++140; ast 13; total bili 0.2; albumin 2.9  05-12-15: wbc 8.2; hgb 10.5; hct 33.3; mcv 87.6; plt 316; glucose 165; bun 14; creat 1.45; k+ 3.8; na++142; hgb a1c 10.5; chol 204; ldl 141; trig 139; hdl 35 1-61-09: wbc 9.4; hgb 11.7; hct 36.3; mcv 89.9; plt 341; glucose 128; bun 15; creat 1.89; k+ 3.7; na++142; lamictal 10.0 05-21-15: wbc 9.6; hgb 11.2; hct 35.7;mcv 88.6; plt 386; glucose 104; bun 14; creat 1.71; k+ 4.0; na++142     Review of Systems  Constitutional: Negative for malaise/fatigue.  Respiratory: Negative for cough and shortness of breath.   Cardiovascular: Negative for chest pain, palpitations and leg swelling.  Gastrointestinal: Negative for heartburn, abdominal pain and constipation.  Musculoskeletal: Positive for joint pain. Negative for myalgias and back pain.       Has right knee pain   Skin: Negative.   Neurological: Negative for dizziness.  Psychiatric/Behavioral: The patient is not nervous/anxious.      Physical Exam  Constitutional: He is oriented to person, place, and time. No distress.  Eyes: Conjunctivae are normal.  Neck: Neck supple. No JVD present. No thyromegaly present.  Cardiovascular: Normal rate, regular rhythm and intact distal pulses.   Respiratory: Effort normal and breath sounds normal. No respiratory distress. He has no wheezes.  GI: Soft. Bowel sounds are normal. He exhibits no distension. There is no tenderness.  Musculoskeletal: He exhibits no edema.  Able to move all extremities  Does have pain with passive range of motion to right knee   Lymphadenopathy:    He has no cervical adenopathy.  Neurological: He is alert and oriented to person, place, and time.  Skin: Skin is warm and dry. He is not diaphoretic.  Psychiatric: He has a normal  mood and affect.      ASSESSMENT/ PLAN:  Will discharge him to the [facility] in [place]. He will not need dme; home health and will not need prescriptions to be written. He will follow up medically with the provider from the receiving facility.     Time spent with patient  40  minutes >50% time spent counseling; reviewing medical record; tests; labs; and developing future plan of care   Synthia Innocent NP [facility]  Contact [phone] Monday through Friday 8am- 5pm  After hours call [phone]

## 2015-05-30 ENCOUNTER — Encounter: Payer: Self-pay | Admitting: Internal Medicine

## 2015-05-30 ENCOUNTER — Non-Acute Institutional Stay (SKILLED_NURSING_FACILITY): Payer: Self-pay | Admitting: Internal Medicine

## 2015-05-30 DIAGNOSIS — I6930 Unspecified sequelae of cerebral infarction: Secondary | ICD-10-CM

## 2015-05-30 DIAGNOSIS — E785 Hyperlipidemia, unspecified: Secondary | ICD-10-CM

## 2015-05-30 DIAGNOSIS — IMO0002 Reserved for concepts with insufficient information to code with codable children: Secondary | ICD-10-CM

## 2015-05-30 DIAGNOSIS — Z951 Presence of aortocoronary bypass graft: Secondary | ICD-10-CM

## 2015-05-30 DIAGNOSIS — E118 Type 2 diabetes mellitus with unspecified complications: Secondary | ICD-10-CM

## 2015-05-30 DIAGNOSIS — I10 Essential (primary) hypertension: Secondary | ICD-10-CM

## 2015-05-30 DIAGNOSIS — R296 Repeated falls: Secondary | ICD-10-CM

## 2015-05-30 DIAGNOSIS — Z794 Long term (current) use of insulin: Secondary | ICD-10-CM

## 2015-05-30 DIAGNOSIS — K279 Peptic ulcer, site unspecified, unspecified as acute or chronic, without hemorrhage or perforation: Secondary | ICD-10-CM

## 2015-05-30 DIAGNOSIS — E1165 Type 2 diabetes mellitus with hyperglycemia: Secondary | ICD-10-CM

## 2015-05-30 DIAGNOSIS — R569 Unspecified convulsions: Secondary | ICD-10-CM

## 2015-05-30 NOTE — Progress Notes (Signed)
Patient ID: Donald Marks, male   DOB: 24-Jun-1960, 55 y.o.   MRN: [id number] .    HISTORY AND PHYSICAL   DATE: 05/30/15  Location:  [facility]    Place of Service: SNF [phone])   Extended Emergency Contact Information Primary Emergency Contact: [place] Phone: [phone] Mobile Phone: [phone] Relation: Sister  Advanced Directive information   FULL CODE  Chief Complaint  Patient presents with  . New Admit To SNF    HPI:  55 yo male seen today as a new admission into SNF following hospital stay for acute CVA, seizures, weakness, afib, HTN, hx CHF, hx CAD s/p CABG. He c/o left sided weakness. He has had frequent falls that were unwitnessed according to nursing. Each time, he was attempting to transfer to or from w/c. No obvious trauma or injury. He is awaiting tx to [facility] to complete rehab. He also c/o insomnia. No seizures since admission into SNF. MRI brain revealed subacute right corona radiata subCM infarct and old b/l basal ganglia and thalamus lacunar infarcts; moderate chronic small vessel ischemic disease  Seizure d/o - stable on lamictal and prn diazepam  HTN - BP elevated on lisinopril and amlodipine  CAD - hx CABG. Takes plavix, statin and lisinopril  DM - no low BS reactions on lantus and humalog SSI  Hyperlipidemia - stable on lipitor  Hx PUD - stable off meds.   He takes vitamins and minerals also.  Past Medical History  Diagnosis Date  . MI (myocardial infarction) ([place])   . Diabetes mellitus without complication ([place])   . Hypertension   . Coronary artery disease   . Seizure ([place])   . CVA (cerebral infarction)   . CVA (cerebral vascular accident) ([place]) 05/2015    Past Surgical History  Procedure Laterality Date  . Coronary artery bypass graft    . Cardiac catheterization    . Cholecystectomy    . Mouth surgery      Patient Care Team: Windy Fast, MD as PCP -  General (Internal Medicine)  Social History   Social History  . Marital Status: Single    Spouse Name: N/A  . Number of Children: N/A  . Years of Education: N/A   Occupational History  . Not on file.   Social History Main Topics  . Smoking status: Never Smoker   . Smokeless tobacco: Never Used  . Alcohol Use: No  . Drug Use: No  . Sexual Activity: Not on file   Other Topics Concern  . Not on file   Social History Narrative     reports that he has never smoked. He has never used smokeless tobacco. He reports that he does not drink alcohol or use illicit drugs.  Family History  Problem Relation Age of Onset  . Hyperlipidemia Mother   . Hypertension Father   . Hypertension Mother   . Hyperlipidemia Father   . Aneurysm Mother   . Aneurysm Maternal Grandmother   . Hypertension Maternal Grandmother   . Diabetes Father    No family status information on file.    Immunization History  Administered Date(s) Administered  . Influenza,inj,Quad PF,36+ Mos 05/12/2015  . Pneumococcal Polysaccharide-23 05/12/2015    Allergies  Allergen Reactions  . Contrast Media [Iodinated Diagnostic Agents]     Medications: Patient's Medications  New Prescriptions   No medications on file  Previous Medications   AMLODIPINE (NORVASC)  10 MG TABLET    Take 10 mg by mouth daily.   ATORVASTATIN (LIPITOR) 80 MG TABLET    Take 80 mg by mouth daily.   CHOLECALCIFEROL (VITAMIN D3) 2000 UNITS CAPSULE    Take 2,000 Units by mouth daily.   CLOPIDOGREL (PLAVIX) 75 MG TABLET    Take 1 tablet (75 mg total) by mouth daily.   DIAZEPAM (DIASTAT) 2.5 MG GEL    Place 2.5 mg rectally once. for Seizure lasting longer than 5 minutes. Please do not use more than 5 times in a month.   INSULIN GLARGINE (LANTUS) 100 UNIT/ML INJECTION    Inject 0.1 mLs (10 Units total) into the skin at bedtime. Patient uses sliding scale   INSULIN LISPRO (HUMALOG) 100 UNIT/ML INJECTION    Give 5 units if blood sugar is greater  than 150   LAMOTRIGINE (LAMICTAL) 150 MG TABLET    Take 1 tablet (150 mg total) by mouth 2 (two) times daily.   LISINOPRIL (PRINIVIL,ZESTRIL) 5 MG TABLET    Take 1 tablet (5 mg total) by mouth daily.   MAGNESIUM OXIDE (MAG-OX) 400 MG TABLET    Take 400 mg by mouth daily.  Modified Medications   No medications on file  Discontinued Medications   AMLODIPINE (NORVASC) 10 MG TABLET    Take 1 tablet (10 mg total) by mouth daily.    Review of Systems  Musculoskeletal: Positive for arthralgias and gait problem.  Neurological: Positive for seizures and weakness.  Psychiatric/Behavioral: Positive for sleep disturbance.  All other systems reviewed and are negative.   Filed Vitals:   05/30/15 1005  BP: 165/90  Pulse: 80  Temp: 98.7 F (37.1 C)  Weight: 244 lb (110.678 kg)   Body mass index is 31.31 kg/(m^2).  Physical Exam  Constitutional: He is oriented to person, place, and time. He appears well-developed and well-nourished.  Sitting in w/c in NAD  HENT:  Mouth/Throat: Oropharynx is clear and moist.  Eyes: Pupils are equal, round, and reactive to light. No scleral icterus.  Neck: Neck supple. Carotid bruit is not present. No thyromegaly present.  Cardiovascular: Normal rate, regular rhythm and intact distal pulses.  Exam reveals no gallop and no friction rub.   Murmur (1/6 SEM) heard. no distal LE swelling. No calf TTP  Pulmonary/Chest: Effort normal and breath sounds normal. He has no wheezes. He has no rales. He exhibits no tenderness.  Abdominal: Soft. Bowel sounds are normal. He exhibits no distension, no abdominal bruit, no pulsatile midline mass and no mass. There is no tenderness. There is no rebound and no guarding.  Musculoskeletal: He exhibits edema.  Lymphadenopathy:    He has no cervical adenopathy.  Neurological: He is alert and oriented to person, place, and time.  Left upper and LE 4/5 strength. No swelling  Skin: Skin is warm and dry. No rash noted.  Psychiatric:  He has a normal mood and affect. His behavior is normal. Thought content normal. His speech is slurred.     Labs reviewed: Admission on 05/21/2015, Discharged on 05/21/2015  Component Date Value Ref Range Status  . Glucose-Capillary 05/21/2015 96  65 - 99 mg/dL Final  . Glucose-Capillary 05/21/2015 106* 65 - 99 mg/dL Final  . Sodium 05/21/2015 142  135 - 145 mmol/L Final  . Potassium 05/21/2015 4.0  3.5 - 5.1 mmol/L Final  . Chloride 05/21/2015 109  101 - 111 mmol/L Final  . CO2 05/21/2015 22  22 - 32 mmol/L Final  . Glucose, Bld  05/21/2015 104* 65 - 99 mg/dL Final  . BUN 05/21/2015 14  6 - 20 mg/dL Final  . Creatinine, Ser 05/21/2015 1.71* 0.61 - 1.24 mg/dL Final  . Calcium 05/21/2015 9.2  8.9 - 10.3 mg/dL Final  . GFR calc non Af Amer 05/21/2015 44* >60 mL/min Final  . GFR calc Af Amer 05/21/2015 51* >60 mL/min Final   Comment: (NOTE) The eGFR has been calculated using the CKD EPI equation. This calculation has not been validated in all clinical situations. eGFR's persistently <60 mL/min signify possible Chronic Kidney Disease.   . Anion gap 05/21/2015 11  5 - 15 Final  . WBC 05/21/2015 9.6  4.0 - 10.5 K/uL Final  . RBC 05/21/2015 4.03* 4.22 - 5.81 MIL/uL Final  . Hemoglobin 05/21/2015 11.2* 13.0 - 17.0 g/dL Final  . HCT 05/21/2015 35.7* 39.0 - 52.0 % Final  . MCV 05/21/2015 88.6  78.0 - 100.0 fL Final  . MCH 05/21/2015 27.8  26.0 - 34.0 pg Final  . MCHC 05/21/2015 31.4  30.0 - 36.0 g/dL Final  . RDW 05/21/2015 13.6  11.5 - 15.5 % Final  . Platelets 05/21/2015 386  150 - 400 K/uL Final  . Neutrophils Relative % 05/21/2015 49   Final  . Neutro Abs 05/21/2015 4.8  1.7 - 7.7 K/uL Final  . Lymphocytes Relative 05/21/2015 32   Final  . Lymphs Abs 05/21/2015 3.0  0.7 - 4.0 K/uL Final  . Monocytes Relative 05/21/2015 9   Final  . Monocytes Absolute 05/21/2015 0.8  0.1 - 1.0 K/uL Final  . Eosinophils Relative 05/21/2015 9   Final  . Eosinophils Absolute 05/21/2015 0.8* 0.0 - 0.7  K/uL Final  . Basophils Relative 05/21/2015 1   Final  . Basophils Absolute 05/21/2015 0.1  0.0 - 0.1 K/uL Final  Admission on 05/16/2015, Discharged on 05/19/2015  Component Date Value Ref Range Status  . WBC 05/16/2015 9.6  4.0 - 10.5 K/uL Final  . RBC 05/16/2015 4.27  4.22 - 5.81 MIL/uL Final  . Hemoglobin 05/16/2015 12.2* 13.0 - 17.0 g/dL Final  . HCT 05/16/2015 37.9* 39.0 - 52.0 % Final  . MCV 05/16/2015 88.8  78.0 - 100.0 fL Final  . MCH 05/16/2015 28.6  26.0 - 34.0 pg Final  . MCHC 05/16/2015 32.2  30.0 - 36.0 g/dL Final  . RDW 05/16/2015 13.6  11.5 - 15.5 % Final  . Platelets 05/16/2015 357  150 - 400 K/uL Final  . Neutrophils Relative % 05/16/2015 52   Final  . Neutro Abs 05/16/2015 5.1  1.7 - 7.7 K/uL Final  . Lymphocytes Relative 05/16/2015 31   Final  . Lymphs Abs 05/16/2015 3.0  0.7 - 4.0 K/uL Final  . Monocytes Relative 05/16/2015 8   Final  . Monocytes Absolute 05/16/2015 0.7  0.1 - 1.0 K/uL Final  . Eosinophils Relative 05/16/2015 8   Final  . Eosinophils Absolute 05/16/2015 0.8* 0.0 - 0.7 K/uL Final  . Basophils Relative 05/16/2015 1   Final  . Basophils Absolute 05/16/2015 0.1  0.0 - 0.1 K/uL Final  . Sodium 05/16/2015 143  135 - 145 mmol/L Final  . Potassium 05/16/2015 3.8  3.5 - 5.1 mmol/L Final  . Chloride 05/16/2015 109  101 - 111 mmol/L Final  . CO2 05/16/2015 21* 22 - 32 mmol/L Final  . Glucose, Bld 05/16/2015 135* 65 - 99 mg/dL Final  . BUN 05/16/2015 17  6 - 20 mg/dL Final  . Creatinine, Ser 05/16/2015 2.02* 0.61 - 1.24  mg/dL Final  . Calcium 05/16/2015 9.3  8.9 - 10.3 mg/dL Final  . Total Protein 05/16/2015 6.9  6.5 - 8.1 g/dL Final  . Albumin 05/16/2015 3.3* 3.5 - 5.0 g/dL Final  . AST 05/16/2015 33  15 - 41 U/L Final  . ALT 05/16/2015 27  17 - 63 U/L Final  . Alkaline Phosphatase 05/16/2015 111  38 - 126 U/L Final  . Total Bilirubin 05/16/2015 0.5  0.3 - 1.2 mg/dL Final  . GFR calc non Af Amer 05/16/2015 36* >60 mL/min Final  . GFR calc Af Amer  05/16/2015 41* >60 mL/min Final   Comment: (NOTE) The eGFR has been calculated using the CKD EPI equation. This calculation has not been validated in all clinical situations. eGFR's persistently <60 mL/min signify possible Chronic Kidney Disease.   . Anion gap 05/16/2015 13  5 - 15 Final  . Color, Urine 05/17/2015 YELLOW  YELLOW Final  . APPearance 05/17/2015 CLOUDY* CLEAR Final  . Specific Gravity, Urine 05/17/2015 1.015  1.005 - 1.030 Final  . pH 05/17/2015 5.0  5.0 - 8.0 Final  . Glucose, UA 05/17/2015 NEGATIVE  NEGATIVE mg/dL Final  . Hgb urine dipstick 05/17/2015 TRACE* NEGATIVE Final  . Bilirubin Urine 05/17/2015 NEGATIVE  NEGATIVE Final  . Ketones, ur 05/17/2015 NEGATIVE  NEGATIVE mg/dL Final  . Protein, ur 05/17/2015 >300* NEGATIVE mg/dL Final  . Nitrite 05/17/2015 NEGATIVE  NEGATIVE Final  . Leukocytes, UA 05/17/2015 NEGATIVE  NEGATIVE Final  . Squamous Epithelial / LPF 05/17/2015 0-5* NONE SEEN Final  . WBC, UA 05/17/2015 0-5  0 - 5 WBC/hpf Final  . RBC / HPF 05/17/2015 0-5  0 - 5 RBC/hpf Final  . Bacteria, UA 05/17/2015 RARE* NONE SEEN Final  . Casts 05/17/2015 HYALINE CASTS* NEGATIVE Final  . Urine-Other 05/17/2015 MUCOUS PRESENT   Final  . Lamotrigine Lvl 05/17/2015 10.0  2.0 - 20.0 ug/mL Final   Comment: (NOTE)                                Detection Limit = 1.0 Performed At: [facility] [place], [place] [id number] Lindon Romp MD FH:[id number]   . Sodium 05/17/2015 142  135 - 145 mmol/L Final  . Potassium 05/17/2015 3.7  3.5 - 5.1 mmol/L Final  . Chloride 05/17/2015 109  101 - 111 mmol/L Final  . CO2 05/17/2015 23  22 - 32 mmol/L Final  . Glucose, Bld 05/17/2015 128* 65 - 99 mg/dL Final  . BUN 05/17/2015 15  6 - 20 mg/dL Final  . Creatinine, Ser 05/17/2015 1.89* 0.61 - 1.24 mg/dL Final  . Calcium 05/17/2015 8.8* 8.9 - 10.3 mg/dL Final  . GFR calc non Af Amer 05/17/2015 39* >60 mL/min Final  . GFR calc Af Amer 05/17/2015 45* >60  mL/min Final   Comment: (NOTE) The eGFR has been calculated using the CKD EPI equation. This calculation has not been validated in all clinical situations. eGFR's persistently <60 mL/min signify possible Chronic Kidney Disease.   . Anion gap 05/17/2015 10  5 - 15 Final  . WBC 05/17/2015 9.4  4.0 - 10.5 K/uL Final  . RBC 05/17/2015 4.04* 4.22 - 5.81 MIL/uL Final  . Hemoglobin 05/17/2015 11.7* 13.0 - 17.0 g/dL Final  . HCT 05/17/2015 36.3* 39.0 - 52.0 % Final  . MCV 05/17/2015 89.9  78.0 - 100.0 fL Final  . MCH 05/17/2015 29.0  26.0 - 34.0 pg  Final  . MCHC 05/17/2015 32.2  30.0 - 36.0 g/dL Final  . RDW 05/17/2015 13.8  11.5 - 15.5 % Final  . Platelets 05/17/2015 341  150 - 400 K/uL Final  . Microalb, Ur 05/17/2015 2341.7* Not Estab. ug/mL Final   Comment: (NOTE) Results confirmed on dilution.   . Microalb Creat Ratio 05/17/2015 2011.8* 0.0 - 30.0 mg/g creat Final   Comment: (NOTE) Performed At: [facility] [place] [place], [place] [id number] Lindon Romp MD RR:[id number]   . Creatinine, Urine 05/17/2015 116.4  Not Estab. mg/dL Final  . MRSA by PCR 05/17/2015 POSITIVE* NEGATIVE Final   Comment:        The GeneXpert MRSA Assay (FDA approved for NASAL specimens only), is one component of a comprehensive MRSA colonization surveillance program. It is not intended to diagnose MRSA infection nor to guide or monitor treatment for MRSA infections. RESULT CALLED TO, READ BACK BY AND VERIFIED WITH: Rollene Rotunda RN 12:10 05/17/15 ([email or web address])   . Glucose-Capillary 05/17/2015 127* 65 - 99 mg/dL Final  . Glucose-Capillary 05/17/2015 133* 65 - 99 mg/dL Final  . WBC 05/18/2015 8.4  4.0 - 10.5 K/uL Final  . RBC 05/18/2015 4.11* 4.22 - 5.81 MIL/uL Final  . Hemoglobin 05/18/2015 11.5* 13.0 - 17.0 g/dL Final  . HCT 05/18/2015 36.0* 39.0 - 52.0 % Final  . MCV 05/18/2015 87.6  78.0 - 100.0 fL Final  . MCH 05/18/2015 28.0  26.0 - 34.0 pg Final  . MCHC 05/18/2015 31.9  30.0 -  36.0 g/dL Final  . RDW 05/18/2015 13.6  11.5 - 15.5 % Final  . Platelets 05/18/2015 351  150 - 400 K/uL Final  . Sodium 05/18/2015 142  135 - 145 mmol/L Final  . Potassium 05/18/2015 3.7  3.5 - 5.1 mmol/L Final  . Chloride 05/18/2015 111  101 - 111 mmol/L Final  . CO2 05/18/2015 24  22 - 32 mmol/L Final  . Glucose, Bld 05/18/2015 120* 65 - 99 mg/dL Final  . BUN 05/18/2015 12  6 - 20 mg/dL Final  . Creatinine, Ser 05/18/2015 1.47* 0.61 - 1.24 mg/dL Final  . Calcium 05/18/2015 8.9  8.9 - 10.3 mg/dL Final  . GFR calc non Af Amer 05/18/2015 52* >60 mL/min Final  . GFR calc Af Amer 05/18/2015 >60  >60 mL/min Final   Comment: (NOTE) The eGFR has been calculated using the CKD EPI equation. This calculation has not been validated in all clinical situations. eGFR's persistently <60 mL/min signify possible Chronic Kidney Disease.   . Anion gap 05/18/2015 7  5 - 15 Final  . Glucose-Capillary 05/17/2015 102* 65 - 99 mg/dL Final  . Glucose-Capillary 05/18/2015 115* 65 - 99 mg/dL Final  . Glucose-Capillary 05/18/2015 109* 65 - 99 mg/dL Final  . Opiates 05/18/2015 NONE DETECTED  NONE DETECTED Final  . Cocaine 05/18/2015 NONE DETECTED  NONE DETECTED Final  . Benzodiazepines 05/18/2015 NONE DETECTED  NONE DETECTED Final  . Amphetamines 05/18/2015 NONE DETECTED  NONE DETECTED Final  . Tetrahydrocannabinol 05/18/2015 NONE DETECTED  NONE DETECTED Final  . Barbiturates 05/18/2015 NONE DETECTED  NONE DETECTED Final   Comment:        DRUG SCREEN FOR MEDICAL PURPOSES ONLY.  IF CONFIRMATION IS NEEDED FOR ANY PURPOSE, NOTIFY LAB WITHIN 5 DAYS.        LOWEST DETECTABLE LIMITS FOR URINE DRUG SCREEN Drug Class       Cutoff (ng/mL) Amphetamine      1000 Barbiturate  200 Benzodiazepine   929 Tricyclics       244 Opiates          300 Cocaine          300 THC              50   . Glucose-Capillary 05/18/2015 138* 65 - 99 mg/dL Final  . Sodium 05/19/2015 142  135 - 145 mmol/L Final  . Potassium  05/19/2015 3.6  3.5 - 5.1 mmol/L Final  . Chloride 05/19/2015 109  101 - 111 mmol/L Final  . CO2 05/19/2015 24  22 - 32 mmol/L Final  . Glucose, Bld 05/19/2015 102* 65 - 99 mg/dL Final  . BUN 05/19/2015 13  6 - 20 mg/dL Final  . Creatinine, Ser 05/19/2015 1.53* 0.61 - 1.24 mg/dL Final  . Calcium 05/19/2015 9.1  8.9 - 10.3 mg/dL Final  . GFR calc non Af Amer 05/19/2015 50* >60 mL/min Final  . GFR calc Af Amer 05/19/2015 58* >60 mL/min Final   Comment: (NOTE) The eGFR has been calculated using the CKD EPI equation. This calculation has not been validated in all clinical situations. eGFR's persistently <60 mL/min signify possible Chronic Kidney Disease.   . Anion gap 05/19/2015 9  5 - 15 Final  . Glucose-Capillary 05/18/2015 108* 65 - 99 mg/dL Final  . Glucose-Capillary 05/18/2015 148* 65 - 99 mg/dL Final  . Comment 1 05/18/2015 Notify RN   Final  . Comment 2 05/18/2015 Document in Chart   Final  . Glucose-Capillary 05/19/2015 83  65 - 99 mg/dL Final  . Glucose-Capillary 05/19/2015 107* 65 - 99 mg/dL Final  . Glucose-Capillary 05/19/2015 132* 65 - 99 mg/dL Final  Admission on 05/11/2015, Discharged on 05/16/2015  Component Date Value Ref Range Status  . Sodium 05/11/2015 141  135 - 145 mmol/L Final  . Potassium 05/11/2015 4.0  3.5 - 5.1 mmol/L Final  . Chloride 05/11/2015 106  101 - 111 mmol/L Final  . BUN 05/11/2015 22* 6 - 20 mg/dL Final  . Creatinine, Ser 05/11/2015 1.90* 0.61 - 1.24 mg/dL Final  . Glucose, Bld 05/11/2015 302* 65 - 99 mg/dL Final  . Calcium, Ion 05/11/2015 1.12  1.12 - 1.23 mmol/L Final  . TCO2 05/11/2015 22  0 - 100 mmol/L Final  . Hemoglobin 05/11/2015 12.9* 13.0 - 17.0 g/dL Final  . HCT 05/11/2015 38.0* 39.0 - 52.0 % Final  . Alcohol, Ethyl (B) 05/11/2015 <5  <5 mg/dL Final   Comment:        LOWEST DETECTABLE LIMIT FOR SERUM ALCOHOL IS 5 mg/dL FOR MEDICAL PURPOSES ONLY   . Prothrombin Time 05/11/2015 12.7  11.6 - 15.2 seconds Final  . INR 05/11/2015 0.93   0.00 - 1.49 Final  . aPTT 05/11/2015 29  24 - 37 seconds Final  . WBC 05/11/2015 7.5  4.0 - 10.5 K/uL Final  . RBC 05/11/2015 4.16* 4.22 - 5.81 MIL/uL Final  . Hemoglobin 05/11/2015 12.1* 13.0 - 17.0 g/dL Final  . HCT 05/11/2015 36.6* 39.0 - 52.0 % Final  . MCV 05/11/2015 88.0  78.0 - 100.0 fL Final  . MCH 05/11/2015 29.1  26.0 - 34.0 pg Final  . MCHC 05/11/2015 33.1  30.0 - 36.0 g/dL Final  . RDW 05/11/2015 13.5  11.5 - 15.5 % Final  . Platelets 05/11/2015 325  150 - 400 K/uL Final  . Neutrophils Relative % 05/11/2015 54   Final  . Neutro Abs 05/11/2015 4.0  1.7 - 7.7 K/uL Final  .  Lymphocytes Relative 05/11/2015 32   Final  . Lymphs Abs 05/11/2015 2.4  0.7 - 4.0 K/uL Final  . Monocytes Relative 05/11/2015 7   Final  . Monocytes Absolute 05/11/2015 0.5  0.1 - 1.0 K/uL Final  . Eosinophils Relative 05/11/2015 6   Final  . Eosinophils Absolute 05/11/2015 0.4  0.0 - 0.7 K/uL Final  . Basophils Relative 05/11/2015 1   Final  . Basophils Absolute 05/11/2015 0.1  0.0 - 0.1 K/uL Final  . Sodium 05/11/2015 140  135 - 145 mmol/L Final  . Potassium 05/11/2015 4.2  3.5 - 5.1 mmol/L Final  . Chloride 05/11/2015 109  101 - 111 mmol/L Final  . CO2 05/11/2015 21* 22 - 32 mmol/L Final  . Glucose, Bld 05/11/2015 304* 65 - 99 mg/dL Final  . BUN 05/11/2015 20  6 - 20 mg/dL Final  . Creatinine, Ser 05/11/2015 2.11* 0.61 - 1.24 mg/dL Final  . Calcium 05/11/2015 8.7* 8.9 - 10.3 mg/dL Final  . Total Protein 05/11/2015 6.1* 6.5 - 8.1 g/dL Final  . Albumin 05/11/2015 2.9* 3.5 - 5.0 g/dL Final  . AST 05/11/2015 21  15 - 41 U/L Final  . ALT 05/11/2015 13* 17 - 63 U/L Final  . Alkaline Phosphatase 05/11/2015 90  38 - 126 U/L Final  . Total Bilirubin 05/11/2015 0.2* 0.3 - 1.2 mg/dL Final  . GFR calc non Af Amer 05/11/2015 34* >60 mL/min Final  . GFR calc Af Amer 05/11/2015 39* >60 mL/min Final   Comment: (NOTE) The eGFR has been calculated using the CKD EPI equation. This calculation has not been  validated in all clinical situations. eGFR's persistently <60 mL/min signify possible Chronic Kidney Disease.   . Anion gap 05/11/2015 10  5 - 15 Final  . Troponin i, poc 05/11/2015 0.00  0.00 - 0.08 ng/mL Final  . Comment 3 05/11/2015          Final   Comment: Due to the release kinetics of cTnI, a negative result within the first hours of the onset of symptoms does not rule out myocardial infarction with certainty. If myocardial infarction is still suspected, repeat the test at appropriate intervals.   . Opiates 05/11/2015 NONE DETECTED  NONE DETECTED Final  . Cocaine 05/11/2015 NONE DETECTED  NONE DETECTED Final  . Benzodiazepines 05/11/2015 NONE DETECTED  NONE DETECTED Final  . Amphetamines 05/11/2015 NONE DETECTED  NONE DETECTED Final  . Tetrahydrocannabinol 05/11/2015 NONE DETECTED  NONE DETECTED Final  . Barbiturates 05/11/2015 NONE DETECTED  NONE DETECTED Final   Comment:        DRUG SCREEN FOR MEDICAL PURPOSES ONLY.  IF CONFIRMATION IS NEEDED FOR ANY PURPOSE, NOTIFY LAB WITHIN 5 DAYS.        LOWEST DETECTABLE LIMITS FOR URINE DRUG SCREEN Drug Class       Cutoff (ng/mL) Amphetamine      1000 Barbiturate      200 Benzodiazepine   720 Tricyclics       947 Opiates          300 Cocaine          300 THC              50   . Color, Urine 05/11/2015 YELLOW  YELLOW Final  . APPearance 05/11/2015 CLEAR  CLEAR Final  . Specific Gravity, Urine 05/11/2015 1.017  1.005 - 1.030 Final  . pH 05/11/2015 6.5  5.0 - 8.0 Final  . Glucose, UA 05/11/2015 >1000* NEGATIVE mg/dL Final  .  Hgb urine dipstick 05/11/2015 NEGATIVE  NEGATIVE Final  . Bilirubin Urine 05/11/2015 NEGATIVE  NEGATIVE Final  . Ketones, ur 05/11/2015 NEGATIVE  NEGATIVE mg/dL Final  . Protein, ur 05/11/2015 100* NEGATIVE mg/dL Final  . Nitrite 05/11/2015 NEGATIVE  NEGATIVE Final  . Leukocytes, UA 05/11/2015 NEGATIVE  NEGATIVE Final  . Squamous Epithelial / LPF 05/11/2015 0-5* NONE SEEN Final  . WBC, UA 05/11/2015  0-5  0 - 5 WBC/hpf Final  . RBC / HPF 05/11/2015 0-5  0 - 5 RBC/hpf Final  . Bacteria, UA 05/11/2015 RARE* NONE SEEN Final  . Lamotrigine Lvl 05/11/2015 None Detected  2.0 - 20.0 ug/mL Final   Comment: (NOTE)                                Detection Limit = 1.0 Performed At: [facility] [place], [place] [id number] Lindon Romp MD [id number]   . Hgb A1c MFr Bld 05/12/2015 10.5* 4.8 - 5.6 % Final   Comment: (NOTE)         Pre-diabetes: 5.7 - 6.4         Diabetes: >6.4         Glycemic control for adults with diabetes: <7.0   . Mean Plasma Glucose 05/12/2015 255   Final   Comment: (NOTE) Performed At: [facility] [place], [place] [id number] Lindon Romp MD GY:[id number]   . Glucose-Capillary 05/11/2015 156* 65 - 99 mg/dL Final  . WBC 05/12/2015 8.2  4.0 - 10.5 K/uL Final  . RBC 05/12/2015 3.80* 4.22 - 5.81 MIL/uL Final  . Hemoglobin 05/12/2015 10.5* 13.0 - 17.0 g/dL Final  . HCT 05/12/2015 33.3* 39.0 - 52.0 % Final  . MCV 05/12/2015 87.6  78.0 - 100.0 fL Final  . MCH 05/12/2015 27.6  26.0 - 34.0 pg Final  . MCHC 05/12/2015 31.5  30.0 - 36.0 g/dL Final  . RDW 05/12/2015 13.6  11.5 - 15.5 % Final  . Platelets 05/12/2015 316  150 - 400 K/uL Final  . Sodium 05/12/2015 142  135 - 145 mmol/L Final  . Potassium 05/12/2015 3.8  3.5 - 5.1 mmol/L Final  . Chloride 05/12/2015 112* 101 - 111 mmol/L Final  . CO2 05/12/2015 22  22 - 32 mmol/L Final  . Glucose, Bld 05/12/2015 165* 65 - 99 mg/dL Final  . BUN 05/12/2015 14  6 - 20 mg/dL Final  . Creatinine, Ser 05/12/2015 1.45* 0.61 - 1.24 mg/dL Final  . Calcium 05/12/2015 8.0* 8.9 - 10.3 mg/dL Final  . GFR calc non Af Amer 05/12/2015 53* >60 mL/min Final  . GFR calc Af Amer 05/12/2015 >60  >60 mL/min Final   Comment: (NOTE) The eGFR has been calculated using the CKD EPI equation. This calculation has not been validated in all clinical situations. eGFR's persistently <60 mL/min  signify possible Chronic Kidney Disease.   . Anion gap 05/12/2015 8  5 - 15 Final  . Glucose-Capillary 05/11/2015 123* 65 - 99 mg/dL Final  . Comment 1 05/11/2015 Notify RN   Final  . Comment 2 05/11/2015 Document in Chart   Final  . Cholesterol 05/12/2015 204* 0 - 200 mg/dL Final  . Triglycerides 05/12/2015 139  <150 mg/dL Final  . HDL 05/12/2015 35* >40 mg/dL Final  . Total CHOL/HDL Ratio 05/12/2015 5.8   Final  . VLDL 05/12/2015 28  0 - 40 mg/dL Final  . LDL Cholesterol 05/12/2015  141* 0 - 99 mg/dL Final   Comment:        Total Cholesterol/HDL:CHD Risk Coronary Heart Disease Risk Table                     Men   Women  1/2 Average Risk   3.4   3.3  Average Risk       5.0   4.4  2 X Average Risk   9.6   7.1  3 X Average Risk  23.4   11.0        Use the calculated Patient Ratio above and the CHD Risk Table to determine the patient's CHD Risk.        ATP III CLASSIFICATION (LDL):  <100     mg/dL   Optimal  100-129  mg/dL   Near or Above                    Optimal  130-159  mg/dL   Borderline  160-189  mg/dL   High  >190     mg/dL   Very High   . Troponin I 05/12/2015 <0.03  <0.031 ng/mL Final   Comment:        NO INDICATION OF MYOCARDIAL INJURY.   . Glucose-Capillary 05/12/2015 141* 65 - 99 mg/dL Final  . Comment 1 05/12/2015 Notify RN   Final  . Comment 2 05/12/2015 Document in Chart   Final  . Glucose-Capillary 05/12/2015 171* 65 - 99 mg/dL Final  . Glucose-Capillary 05/12/2015 150* 65 - 99 mg/dL Final  . Comment 1 05/12/2015 Notify RN   Final  . Comment 2 05/12/2015 Document in Chart   Final  . Glucose-Capillary 05/12/2015 150* 65 - 99 mg/dL Final  . Comment 1 05/12/2015 Notify RN   Final  . Comment 2 05/12/2015 Document in Chart   Final  . Glucose-Capillary 05/13/2015 122* 65 - 99 mg/dL Final  . Comment 1 05/13/2015 Notify RN   Final  . Comment 2 05/13/2015 Document in Chart   Final  . Sodium 05/13/2015 142  135 - 145 mmol/L Final  . Potassium 05/13/2015 4.2   3.5 - 5.1 mmol/L Final  . Chloride 05/13/2015 113* 101 - 111 mmol/L Final  . CO2 05/13/2015 23  22 - 32 mmol/L Final  . Glucose, Bld 05/13/2015 116* 65 - 99 mg/dL Final  . BUN 05/13/2015 13  6 - 20 mg/dL Final  . Creatinine, Ser 05/13/2015 1.49* 0.61 - 1.24 mg/dL Final  . Calcium 05/13/2015 8.7* 8.9 - 10.3 mg/dL Final  . GFR calc non Af Amer 05/13/2015 52* >60 mL/min Final  . GFR calc Af Amer 05/13/2015 60* >60 mL/min Final   Comment: (NOTE) The eGFR has been calculated using the CKD EPI equation. This calculation has not been validated in all clinical situations. eGFR's persistently <60 mL/min signify possible Chronic Kidney Disease.   . Anion gap 05/13/2015 6  5 - 15 Final  . WBC 05/13/2015 7.8  4.0 - 10.5 K/uL Final  . RBC 05/13/2015 4.14* 4.22 - 5.81 MIL/uL Final  . Hemoglobin 05/13/2015 11.6* 13.0 - 17.0 g/dL Final  . HCT 05/13/2015 36.7* 39.0 - 52.0 % Final  . MCV 05/13/2015 88.6  78.0 - 100.0 fL Final  . MCH 05/13/2015 28.0  26.0 - 34.0 pg Final  . MCHC 05/13/2015 31.6  30.0 - 36.0 g/dL Final  . RDW 05/13/2015 13.5  11.5 - 15.5 % Final  . Platelets 05/13/2015 325  150 -  400 K/uL Final  . Glucose-Capillary 05/13/2015 108* 65 - 99 mg/dL Final  . Glucose-Capillary 05/13/2015 163* 65 - 99 mg/dL Final  . Glucose-Capillary 05/13/2015 135* 65 - 99 mg/dL Final  . Comment 1 05/13/2015 Notify RN   Final  . Comment 2 05/13/2015 Document in Chart   Final  . Sodium 05/14/2015 141  135 - 145 mmol/L Final  . Potassium 05/14/2015 3.6  3.5 - 5.1 mmol/L Final  . Chloride 05/14/2015 109  101 - 111 mmol/L Final  . CO2 05/14/2015 22  22 - 32 mmol/L Final  . Glucose, Bld 05/14/2015 119* 65 - 99 mg/dL Final  . BUN 05/14/2015 11  6 - 20 mg/dL Final  . Creatinine, Ser 05/14/2015 1.49* 0.61 - 1.24 mg/dL Final  . Calcium 05/14/2015 8.9  8.9 - 10.3 mg/dL Final  . GFR calc non Af Amer 05/14/2015 52* >60 mL/min Final  . GFR calc Af Amer 05/14/2015 60* >60 mL/min Final   Comment: (NOTE) The eGFR has  been calculated using the CKD EPI equation. This calculation has not been validated in all clinical situations. eGFR's persistently <60 mL/min signify possible Chronic Kidney Disease.   . Anion gap 05/14/2015 10  5 - 15 Final  . Glucose-Capillary 05/14/2015 115* 65 - 99 mg/dL Final  . Comment 1 05/14/2015 Notify RN   Final  . Comment 2 05/14/2015 Document in Chart   Final  . Glucose-Capillary 05/14/2015 141* 65 - 99 mg/dL Final  . Glucose-Capillary 05/14/2015 136* 65 - 99 mg/dL Final  . Glucose-Capillary 05/14/2015 161* 65 - 99 mg/dL Final  . Glucose-Capillary 05/14/2015 120* 65 - 99 mg/dL Final  . Glucose-Capillary 05/15/2015 126* 65 - 99 mg/dL Final  . Glucose-Capillary 05/15/2015 162* 65 - 99 mg/dL Final  . Glucose-Capillary 05/15/2015 163* 65 - 99 mg/dL Final  . Glucose-Capillary 05/15/2015 116* 65 - 99 mg/dL Final  . Comment 1 05/15/2015 Notify RN   Final  . Comment 2 05/15/2015 Document in Chart   Final  . Glucose-Capillary 05/16/2015 116* 65 - 99 mg/dL Final  . Comment 1 05/16/2015 Notify RN   Final  . Comment 2 05/16/2015 Document in Chart   Final  . Glucose-Capillary 05/16/2015 133* 65 - 99 mg/dL Final  . Comment 1 05/16/2015 Notify RN   Final  . Comment 2 05/16/2015 Document in Chart   Final  . Glucose-Capillary 05/16/2015 132* 65 - 99 mg/dL Final  . Comment 1 05/16/2015 Notify RN   Final  . Comment 2 05/16/2015 Document in Chart   Final    Dg Chest 1 View  05/17/2015  CLINICAL DATA:  55 year old male with mental status change EXAM: CHEST 1 VIEW COMPARISON:  Chest radiograph dated 07/10/2006 FINDINGS: Single view of the chest demonstrates cardiomegaly. Median sternotomy wires and CABG vascular clips noted. There is minimal prominence of the central vasculature may represent mild congestive changes. No focal consolidation, pleural effusion, or pneumothorax. No acute osseous pathology. IMPRESSION: Cardiomegaly with possible mild congestive changes. No focal consolidation.  Electronically Signed   By: Anner Crete M.D.   On: 05/17/2015 01:02   Ct Head Wo Contrast  05/17/2015  CLINICAL DATA:  55 year old male with altered mental status EXAM: CT HEAD WITHOUT CONTRAST TECHNIQUE: Contiguous axial images were obtained from the base of the skull through the vertex without intravenous contrast. COMPARISON:  MRI dated 05/11/2015 and head CT dated 07/10/2006 FINDINGS: There is no acute intracranial hemorrhage. The ventricles and sulci are appropriate in size for patient's age. Mild  periventricular and deep white matter chronic microvascular ischemic changes noted. There is a a small area of infarct and encephalomalacia in the right periventricular white matter and corona radiata extending into the area of right internal capsule. There is no mass effect or midline shift. There is mild mucoperiosteal thickening of the left maxillary sinus. The remainder of the visualized paranasal sinuses and mastoid air cells are clear. The calvarium is intact. IMPRESSION: No acute intracranial hemorrhage. Periventricular and deep white matter chronic microvascular ischemic changes as well as a small focus of subacute/old infarct in the right periventricular white matter and right internal capsule. If symptoms persist and there are no contraindications, MRI may provide better evaluation if clinically indicated Electronically Signed   By: Anner Crete M.D.   On: 05/17/2015 00:42   Mr Brain Wo Contrast  05/17/2015  CLINICAL DATA:  Breakthrough seizure, history of seizures and remote head injury. History of hypertension, diabetes and stroke. EXAM: MRI HEAD WITHOUT CONTRAST TECHNIQUE: Multiplanar, multiecho pulse sequences of the brain and surrounding structures were obtained without intravenous contrast. COMPARISON:  CT head May 16, 2015 and MRI of the brain May 11, 2015 FINDINGS: Subcentimeter focus of reduced diffusion RIGHT corona radiata to internal capsule with normalized ADC values,  present on prior MRI. No new foci of acute ischemia. Old bilateral basal ganglia and thalamus lacunar infarcts, faint susceptibility associated with RIGHT basal ganglia infarct. No lobar hematoma. Patchy to confluent supratentorial white matter FLAIR T2 hyperintensities are unchanged. No midline shift, mass effect or mass lesions. No abnormal extra-axial fluid collections. Normal major intracranial vascular flow voids present at skull base. Status post LEFT ocular lens implant. Mild LEFT maxillary mucosal thickening. No paranasal sinus air-fluid levels. Mastoid air cells are well aerated. No abnormal sellar expansion. No cerebellar tonsillar ectopia. No suspicious calvarial bone marrow signal. IMPRESSION: No acute intracranial process. Evolving subacute RIGHT corona radiata subcentimeter infarct. Old bilateral basal ganglia and thalamus lacunar infarcts. Moderate chronic small vessel ischemic disease, advanced for age though stable from prior imaging. Electronically Signed   By: Elon Alas M.D.   On: 05/17/2015 04:06   Mr Brain Wo Contrast  05/11/2015  CLINICAL DATA:  55 year old male with sudden onset left side weakness since last night. Symptom onset 2100 hours. Initial encounter. EXAM: MRI HEAD WITHOUT CONTRAST TECHNIQUE: Multiplanar, multiecho pulse sequences of the brain and surrounding structures were obtained without intravenous contrast. COMPARISON:  Head CT without contrast 07/10/2006. FINDINGS: 9 mm oval area of restricted diffusion in the posterior right corona radiata tracking to the putamen. No other convincing restricted diffusion. Subtle signal abnormality along the left ventral brainstem on series 3, image 18 does not look suspicious on thin coronal imaging (series 5, image 9). Major intracranial vascular flow voids are preserved. There are multifocal chronic deep gray matter and corona radiata lacunar infarcts superimposed on the acute finding, more so in the right hemisphere. The brainstem  and cerebellum appear spared. No associated cerebral cortical encephalomalacia. Occasional chronic micro hemorrhages. No midline shift, mass effect, evidence of mass lesion, ventriculomegaly, extra-axial collection or acute intracranial hemorrhage. Cervicomedullary junction and pituitary are within normal limits. Negative visualized cervical spine. Normal bone marrow signal. Visible internal auditory structures appear normal. Mastoids are clear. Trace mucosal thickening in the paranasal sinuses. Postoperative changes to the left globe. Otherwise negative orbit and scalp soft tissues. IMPRESSION: 1. Acute on chronic small vessel infarct affecting the right corona radiata and putamen. No associated mass effect or hemorrhage. 2. Numerous underlying chronic lacunar  infarcts in the bilateral deep gray matter nuclei (MCA and PCA territories). Electronically Signed   By: Genevie Ann M.D.   On: 05/11/2015 12:35   Dg Chest Port 1 View  05/21/2015  CLINICAL DATA:  Seizure today EXAM: PORTABLE CHEST 1 VIEW COMPARISON:  05/16/2015 FINDINGS: Upper normal heart size. Low volumes. Mild basilar atelectasis. No pneumothorax. IMPRESSION: Bibasilar atelectasis. Electronically Signed   By: Marybelle Killings M.D.   On: 05/21/2015 10:39   Dg Knee Complete 4 Views Right  05/16/2015  CLINICAL DATA:  55 year old who fell yesterday while in the shower at home, injuring the right knee. Anterior and lateral pain. Initial encounter. EXAM: RIGHT KNEE - COMPLETE 4+ VIEW COMPARISON:  None. FINDINGS: No evidence of acute fracture or dislocation. Mild medial compartment joint space narrowing. Patellofemoral and lateral compartment joint spaces well preserved. Bone mineral density well-preserved. Circumscribed mixed lucent and sclerotic cortical lesion arising from the posterior cortex of the proximal tibial metaphysis. No other intrinsic osseous abnormality. Small to moderate-sized joint effusion. Mild prepatellar soft tissue swelling. IMPRESSION: 1.  No acute osseous abnormality. 2. Small to moderate-sized joint effusion. 3. 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 4. 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 Electronically Signed   By: Evangeline Dakin M.D.   On: 05/16/2015 15:26     Assessment/Plan   ICD-9-CM ICD-10-CM   1. History of stroke with residual deficit - left hemiparesis; dysarthria 438.9 I69.30   2. Seizures (HCC) 780.39 R56.9   3. Uncontrolled type 2 diabetes mellitus with complication, with long-term current use of insulin (HCC) 250.82 E11.8    V58.67 E11.65     Z79.4   4. Essential hypertension 401.9 I10   5. HLD (hyperlipidemia) 272.4 E78.5   6. Peptic ulcer disease - asymptomatic 533.90 K27.9   7. S/P CABG (coronary artery bypass graft) V45.81 Z95.1   8.      Frequent falls - due to #1  Do not recommend insomnia med due to pt's seizure hx and he states he has seizures in his sleep  Fall precautions. Do not transfer without assistance  Increase lisinopril 67m daily  Cont other meds as ordered  [facility]  Cont PT/OT/ST as ordered  [place] [place] [facility] [place] Communicated with pt and nursing.  Will follow  Shilah Hefel S. CPerlie Gold PRoyal [facility]aks Hospitaland Adult Medicine [address]GM[place]berry [place] [phone] (Monday-Friday 8 AM - 5 PM) [phone] 5 PM and follow prompts

## 2015-05-31 ENCOUNTER — Encounter: Payer: Self-pay | Admitting: Internal Medicine

## 2015-06-01 ENCOUNTER — Observation Stay (HOSPITAL_COMMUNITY)
Admission: EM | Admit: 2015-06-01 | Discharge: 2015-06-02 | Disposition: A | Discharge status: Home / Self Care | Payer: Non-veteran care | Attending: Family Medicine | Admitting: Family Medicine

## 2015-06-01 ENCOUNTER — Encounter (HOSPITAL_COMMUNITY): Payer: Self-pay | Admitting: *Deleted

## 2015-06-01 ENCOUNTER — Emergency Department (HOSPITAL_COMMUNITY): Payer: Non-veteran care

## 2015-06-01 DIAGNOSIS — F449 Dissociative and conversion disorder, unspecified: Secondary | ICD-10-CM | POA: Diagnosis not present

## 2015-06-01 DIAGNOSIS — Z8673 Personal history of transient ischemic attack (TIA), and cerebral infarction without residual deficits: Secondary | ICD-10-CM | POA: Insufficient documentation

## 2015-06-01 DIAGNOSIS — Z7902 Long term (current) use of antithrombotics/antiplatelets: Secondary | ICD-10-CM | POA: Insufficient documentation

## 2015-06-01 DIAGNOSIS — R079 Chest pain, unspecified: Secondary | ICD-10-CM | POA: Diagnosis not present

## 2015-06-01 DIAGNOSIS — I2510 Atherosclerotic heart disease of native coronary artery without angina pectoris: Secondary | ICD-10-CM | POA: Insufficient documentation

## 2015-06-01 DIAGNOSIS — I159 Secondary hypertension, unspecified: Secondary | ICD-10-CM | POA: Diagnosis not present

## 2015-06-01 DIAGNOSIS — E1169 Type 2 diabetes mellitus with other specified complication: Secondary | ICD-10-CM | POA: Diagnosis not present

## 2015-06-01 DIAGNOSIS — I10 Essential (primary) hypertension: Secondary | ICD-10-CM | POA: Insufficient documentation

## 2015-06-01 DIAGNOSIS — Z794 Long term (current) use of insulin: Secondary | ICD-10-CM | POA: Insufficient documentation

## 2015-06-01 DIAGNOSIS — I25709 Atherosclerosis of coronary artery bypass graft(s), unspecified, with unspecified angina pectoris: Secondary | ICD-10-CM | POA: Insufficient documentation

## 2015-06-01 DIAGNOSIS — R42 Dizziness and giddiness: Secondary | ICD-10-CM | POA: Insufficient documentation

## 2015-06-01 DIAGNOSIS — Z951 Presence of aortocoronary bypass graft: Secondary | ICD-10-CM | POA: Diagnosis not present

## 2015-06-01 DIAGNOSIS — R4182 Altered mental status, unspecified: Secondary | ICD-10-CM | POA: Diagnosis not present

## 2015-06-01 DIAGNOSIS — E119 Type 2 diabetes mellitus without complications: Secondary | ICD-10-CM | POA: Diagnosis not present

## 2015-06-01 DIAGNOSIS — Z79899 Other long term (current) drug therapy: Secondary | ICD-10-CM | POA: Diagnosis not present

## 2015-06-01 LAB — URINALYSIS, ROUTINE W REFLEX MICROSCOPIC
BILIRUBIN URINE: NEGATIVE
GLUCOSE, UA: NEGATIVE mg/dL
KETONES UR: NEGATIVE mg/dL
LEUKOCYTES UA: NEGATIVE
NITRITE: NEGATIVE
Protein, ur: >300 mg/dL — AB
Specific Gravity, Urine: 1.015 (ref 1.005–1.030)
pH: 6.5 (ref 5.0–8.0)

## 2015-06-01 LAB — GLUCOSE, CAPILLARY
GLUCOSE-CAPILLARY: 87 mg/dL (ref 65–99)
GLUCOSE-CAPILLARY: 135 mg/dL — AB (ref 65–99)

## 2015-06-01 LAB — HEPATIC FUNCTION PANEL
ALBUMIN: 3.5 g/dL (ref 3.5–5.0)
ALT: 21 U/L (ref 17–63)
AST: 20 U/L (ref 15–41)
Alkaline Phosphatase: 108 U/L (ref 38–126)
Total Bilirubin: 0.4 mg/dL (ref 0.3–1.2)
Total Protein: 7.2 g/dL (ref 6.5–8.1)

## 2015-06-01 LAB — URINE MICROSCOPIC-ADD ON

## 2015-06-01 LAB — CBG MONITORING, ED: GLUCOSE-CAPILLARY: 123 mg/dL — AB (ref 65–99)

## 2015-06-01 LAB — TROPONIN I: Troponin I: <0.03 ng/mL (ref <0.031)

## 2015-06-01 LAB — RAPID URINE DRUG SCREEN, HOSP PERFORMED
AMPHETAMINES: NOT DETECTED
Barbiturates: NOT DETECTED
Benzodiazepines: NOT DETECTED
Cocaine: NOT DETECTED
OPIATES: NOT DETECTED
Tetrahydrocannabinol: NOT DETECTED

## 2015-06-01 LAB — CBC
HEMATOCRIT: 38.1 % — AB (ref 39.0–52.0)
Hemoglobin: 12.6 g/dL — ABNORMAL LOW (ref 13.0–17.0)
MCH: 29.2 pg (ref 26.0–34.0)
MCHC: 33.1 g/dL (ref 30.0–36.0)
MCV: 88.2 fL (ref 78.0–100.0)
Platelets: 499 10*3/uL — ABNORMAL HIGH (ref 150–400)
RBC: 4.32 MIL/uL (ref 4.22–5.81)
RDW: 13.4 % (ref 11.5–15.5)
WBC: 9.1 10*3/uL (ref 4.0–10.5)

## 2015-06-01 LAB — I-STAT TROPONIN, ED
TROPONIN I, POC: 0 ng/mL (ref 0.00–0.08)
Troponin i, poc: 0.01 ng/mL (ref 0.00–0.08)

## 2015-06-01 LAB — BASIC METABOLIC PANEL
Anion gap: 11 (ref 5–15)
BUN: 6 mg/dL (ref 6–20)
CHLORIDE: 109 mmol/L (ref 101–111)
CO2: 25 mmol/L (ref 22–32)
Calcium: 9.4 mg/dL (ref 8.9–10.3)
Creatinine, Ser: 1.48 mg/dL — ABNORMAL HIGH (ref 0.61–1.24)
GFR calc Af Amer: >60 mL/min (ref >60)
GFR calc non Af Amer: 52 mL/min — ABNORMAL LOW (ref >60)
GLUCOSE: 137 mg/dL — AB (ref 65–99)
POTASSIUM: 3.8 mmol/L (ref 3.5–5.1)
Sodium: 145 mmol/L (ref 135–145)

## 2015-06-01 MED ORDER — HYDRALAZINE HCL 20 MG/ML IJ SOLN: 10.0000 mg | INTRAMUSCULAR | Status: DC | PRN

## 2015-06-01 MED ORDER — VITAMIN D 1000 UNITS PO TABS
2000.0000 [IU] | ORAL_TABLET | Freq: Every day | ORAL | Status: DC
Start: 2015-06-01 — End: 2015-06-02
  Administered 2015-06-01 – 2015-06-02 (×2): 2000 [IU] via ORAL
  Filled 2015-06-01 (×2): qty 2

## 2015-06-01 MED ORDER — ENOXAPARIN SODIUM 40 MG/0.4ML ~~LOC~~ SOLN
40.0000 mg | SUBCUTANEOUS | Status: DC
  Administered 2015-06-01: 40 mg via SUBCUTANEOUS
  Filled 2015-06-01: qty 0.4

## 2015-06-01 MED ORDER — CARVEDILOL 12.5 MG PO TABS
12.5000 mg | ORAL_TABLET | Freq: Two times a day (BID) | ORAL | Status: DC
  Administered 2015-06-01: 12.5 mg via ORAL
  Filled 2015-06-01: qty 1

## 2015-06-01 MED ORDER — INSULIN GLARGINE 100 UNIT/ML ~~LOC~~ SOLN
10.0000 [IU] | Freq: Every day | SUBCUTANEOUS | Status: DC
  Administered 2015-06-01: 10 [IU] via SUBCUTANEOUS
  Filled 2015-06-01 (×2): qty 0.1

## 2015-06-01 MED ORDER — ONDANSETRON HCL 4 MG/2ML IJ SOLN: 4.0000 mg | Freq: Four times a day (QID) | INTRAMUSCULAR | Status: DC | PRN

## 2015-06-01 MED ORDER — SODIUM CHLORIDE 0.9 % IV SOLN
INTRAVENOUS | Status: DC
  Administered 2015-06-01: 17:00:00 via INTRAVENOUS

## 2015-06-01 MED ORDER — LISINOPRIL 5 MG PO TABS
5.0000 mg | ORAL_TABLET | Freq: Every day | ORAL | Status: DC
  Administered 2015-06-01: 5 mg via ORAL
  Filled 2015-06-01: qty 1

## 2015-06-01 MED ORDER — INSULIN ASPART 100 UNIT/ML ~~LOC~~ SOLN: 0.0000 [IU] | Freq: Three times a day (TID) | SUBCUTANEOUS | Status: DC

## 2015-06-01 MED ORDER — ATORVASTATIN CALCIUM 80 MG PO TABS
80.0000 mg | ORAL_TABLET | Freq: Every day | ORAL | Status: DC
  Administered 2015-06-01: 80 mg via ORAL
  Filled 2015-06-01: qty 1

## 2015-06-01 MED ORDER — HYDROCHLOROTHIAZIDE 25 MG PO TABS
25.0000 mg | ORAL_TABLET | Freq: Every day | ORAL | Status: DC
  Administered 2015-06-02: 25 mg via ORAL
  Filled 2015-06-01: qty 1

## 2015-06-01 MED ORDER — HYDRALAZINE HCL 20 MG/ML IJ SOLN
10.0000 mg | Freq: Once | INTRAMUSCULAR | Status: AC
  Administered 2015-06-01: 10 mg via INTRAVENOUS
  Filled 2015-06-01: qty 1

## 2015-06-01 MED ORDER — LAMOTRIGINE 100 MG PO TABS
150.0000 mg | ORAL_TABLET | Freq: Two times a day (BID) | ORAL | Status: DC
  Administered 2015-06-02: 150 mg via ORAL
  Filled 2015-06-01: qty 2

## 2015-06-01 MED ORDER — CLOPIDOGREL BISULFATE 75 MG PO TABS
75.0000 mg | ORAL_TABLET | Freq: Every day | ORAL | Status: DC
  Administered 2015-06-01 – 2015-06-02 (×2): 75 mg via ORAL
  Filled 2015-06-01 (×2): qty 1

## 2015-06-01 MED ORDER — TRAMADOL HCL 50 MG PO TABS: 50.0000 mg | ORAL_TABLET | Freq: Four times a day (QID) | ORAL | Status: DC | PRN

## 2015-06-01 MED ORDER — ACETAMINOPHEN 325 MG PO TABS
650.0000 mg | ORAL_TABLET | ORAL | Status: DC | PRN
  Filled 2015-06-01: qty 2

## 2015-06-01 MED ORDER — AMLODIPINE BESYLATE 10 MG PO TABS
10.0000 mg | ORAL_TABLET | Freq: Every day | ORAL | Status: DC
  Administered 2015-06-01 – 2015-06-02 (×2): 10 mg via ORAL
  Filled 2015-06-01 (×2): qty 1

## 2015-06-01 MED ORDER — ASPIRIN 81 MG PO CHEW: 81.0000 mg | CHEWABLE_TABLET | Freq: Every day | ORAL | Status: DC | PRN

## 2015-06-01 NOTE — Progress Notes (Signed)
PRN dose of hydralazine given. RN to follow blood pressure

## 2015-06-01 NOTE — ED Notes (Signed)
PA [place] aware of seizure activity "spells" pt is having,  Jerking movement only involving head, lasting approximately 30 secs.  Pt drooling, following commands at this time.

## 2015-06-01 NOTE — ED Notes (Signed)
Patient transported to CT 

## 2015-06-01 NOTE — ED Notes (Signed)
Pt returned to room from CT

## 2015-06-01 NOTE — ED Notes (Signed)
Porfirio Mylar (sister) updated, ok per pt.  [phone]-please update with plan of care.

## 2015-06-01 NOTE — ED Notes (Signed)
Pt twitching head back and forth, responsive to voice,  PA aware, at bedside

## 2015-06-01 NOTE — ED Notes (Signed)
Pt transported to CT ?

## 2015-06-01 NOTE — ED Notes (Signed)
Pt returned from CT, follows some commands, alert to voice.  VSS at this time

## 2015-06-01 NOTE — Progress Notes (Signed)
PT B/P 203/108. Pt has not received any medications today. RN has administered all Medications, will follow blood pressure

## 2015-06-01 NOTE — H&P (Signed)
Family [facility] Admission History and Physical Service Pager: [phone]  Patient name: Donald Marks Medical record number: [id number] Date of birth: Mar 01, 1961 Age: 55 y.o. Gender: male  Primary Care Provider: Sondra Come, MD Consultants: Cardiology, Neurology Code Status: DNR per conversation with patient on admission  Chief Complaint: chest pain  Assessment and Plan: Donald Marks is a 55 y.o. male with history of CAD s/p bypass and recent MI with stent placement, recent CVA with initial left-sided hemiparesis, T2DM, HTN, seizures and question of pancreatic cancer, presenting with acute onset chest pain x 45 minutes this a.m. EKG unchanged from previous. Troponin negative x 1. 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 Patient without chest pain since this morning, but history concerning given extensive CAD complications.  Chest pain: No recurrence since admission. Heart score of 4 for previous history, age and presentation.   - Admit for observation on telemetry, attending Dr. Gwendolyn Grant - Cardiology consulted, appreciate recommendations - Continue home asa 81 mg - Tylenol 650 mg q4h prn - Repeat EKG in the a.m. - [facility]  Seizure-like episode: History of seizures since the 1980s. 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 Negative head CT in ED.  - NPO for seizure-like episode - BSE ordered - Continue home lamictal 150 mg BID - Neurology consulted, signed off with recommendation to consider psychiatric consult to investigate triggers of psychogenic seizure events.   HTN: BP elevated to 197/105 after administration of home medications.  - Monitor BP q2h until improves - Continue home amlodipine 10 mg daily - Continue home lisinopril 5 mg daily - Hydralazine 10 mg q4h prn for BPs > 180/110  CAD: - Continue home plavix 75 mg for history of stent - Continue asa 81 mg daily -  Continue home lipitor 80 mg - Continue home BP medications as above  T2DM: Poorly controlled with Hgb A1c of 10.5 05/12/15 - Continue home lantus at home dose 10 units nightly, as has been administered at rehab facility - SSI sensitive - CBGs qACHS  Recent CVA: Minimal residual left-sided weakness on exam - Consider PT for continued strength training while admitted  FEN/GI: NPO for swallow study but carb-modified/heart healthy diet once cleared, KVO, zofran 4 mg 6qh prn for nausea Prophylaxis: Lovenox  Disposition: Telemetry, on observation for chest pain work-up  History of Present Illness:  Donald Marks is a 55 y.o. male presenting with chest pain and seizure-like activity in the ED. He says pain began this morning around 6:30 and resolved around 7:15. It woke him from sleep. He described the pain as constant, centralized and tight. It did not radiate anywhere. Changes in position did not make it worse. He denies associated nausea or diaphoresis. He took aspirin, which helped somewhat. He last physically exerted himself yesterday during physical therapy but had no angina. He said the last time he had chest pain like this was in January when he had 2 MIs, per patient. He sees a [place] cardiologist. He says he had a fever a couple of days ago and emesis x 1 after a meal he did not like. He continues to have weakness after his CVA earlier this month. He is at a rehab center that has been administering all of his medications.   In the ED, iStat troponin was negative. EKG with anterior ST elevation likely due to LVH and unchanged from prior tracing in February. SCr around baseline at 1.48. Glucose slightly elevated  at 137. UA with trace hemoglobin and > 300 protein; microscopic with hyaline casts, few bacteria and squamous epithelial cells. Hepatic function panel was normal. Hgb of 12.6 improved from last hospitalization. UDS negative. Of note, patient had a shaking spell in ED during which he twitched  his head back and forth and had drooling and was briefly unresponsive to commands. It lasted about 30 seconds and shortly afterwards he could follow commands. He has no recollection of this event and did not lose control of bladder or bowels. Neurology saw him in the ED and noted that he responded after arousal with ammonia inhalation stimulus and had nonfocal neurological exam. Spell believed to be psychogenic in nature with no further work-up recommended. CT performed to rule-out acute infarct, and was negative.   Review Of Systems: Per HPI with the following additions: No nausea or diarrhea.  Otherwise the remainder of the systems were negative.  Patient Active Problem List   Diagnosis Date Noted  . Chest pain 06/01/2015  . S/P CABG (coronary artery bypass graft) 05/30/2015  . Spell of shaking   . Seizure (HCC) 05/17/2015  . Altered mental state 05/17/2015  . Elevated serum creatinine   . HLD (hyperlipidemia)   . Left hemiplegia (HCC) 05/12/2015  . Acute CVA (cerebrovascular accident) (HCC) 05/11/2015  . Stroke (cerebrum) (HCC) 05/11/2015  . CVA (cerebral infarction) 05/11/2015  . Hyperglycemia   . AKI (acute kidney injury) (HCC)   . Seizures (HCC)   . Uncontrolled type 2 diabetes mellitus with complication, with long-term current use of insulin (HCC)   . Essential hypertension     Past Medical History: Past Medical History  Diagnosis Date  . MI (myocardial infarction) (HCC)   . Diabetes mellitus without complication (HCC)   . Hypertension   . Coronary artery disease   . Seizure (HCC)   . CVA (cerebral infarction)   . CVA (cerebral vascular accident) (HCC) 05/2015    Past Surgical History: Past Surgical History  Procedure Laterality Date  . Coronary artery bypass graft    . Cardiac catheterization    . Cholecystectomy    . Mouth surgery      Social History: Social History  Substance Use Topics  . Smoking status: Never Smoker   . Smokeless tobacco: Never Used  .  Alcohol Use: No   Additional social history: Undergoing rehab at [facility] s/p stroke. Lives alone in [place].  Please also refer to relevant sections of EMR.   Family History: Family History  Problem Relation Age of Onset  . Hyperlipidemia Mother   . Hypertension Father   . Hypertension Mother   . Hyperlipidemia Father   . Aneurysm Mother   . Aneurysm Maternal Grandmother   . Hypertension Maternal Grandmother   . Diabetes Father     Allergies and Medications: Allergies  Allergen Reactions  . Contrast Media [Iodinated Diagnostic Agents]    No current facility-administered medications on file prior to encounter.   Current Outpatient Prescriptions on File Prior to Encounter  Medication Sig Dispense Refill  . amLODipine (NORVASC) 10 MG tablet Take 10 mg by mouth daily.    Marland Kitchen atorvastatin (LIPITOR) 80 MG tablet Take 80 mg by mouth daily.    . Cholecalciferol (VITAMIN D3) 2000 units capsule Take 2,000 Units by mouth daily.    . clopidogrel (PLAVIX) 75 MG tablet Take 1 tablet (75 mg total) by mouth daily. 30 tablet 3  . diazepam (DIASTAT) 2.5 MG GEL Place 2.5 mg rectally  once. for Seizure lasting longer than 5 minutes. Please do not use more than 5 times in a month. 5 Package 0  . insulin glargine (LANTUS) 100 UNIT/ML injection Inject 0.1 mLs (10 Units total) into the skin at bedtime. Patient uses sliding scale 10 mL   . insulin lispro (HUMALOG) 100 UNIT/ML injection Give 5 units if blood sugar is greater than 150    . lamoTRIgine (LAMICTAL) 150 MG tablet Take 1 tablet (150 mg total) by mouth 2 (two) times daily. 60 tablet   . lisinopril (PRINIVIL,ZESTRIL) 5 MG tablet Take 1 tablet (5 mg total) by mouth daily. 30 tablet 0  . magnesium oxide (MAG-OX) 400 MG tablet Take 400 mg by mouth daily.      Objective: BP 197/107 mmHg  Pulse 76  Temp(Src) 98.5 F (36.9 C) (Oral)  Resp 17  Ht  (1.549 m)  Wt 236 lb 4.8 oz (107.185 kg)  BMI 44.67 kg/m2  SpO2  100% Exam: General: Well-nourished male, resting in bed with eyes closed Eyes: PERRL, EOMI.  ENTM: No oropharyngeal erythema. MMM. Neck: FROM, supple Cardiovascular: RRR, S1, S2, no m/r/g Chest: Lungs CTAB. No increased WOB. No chest wall tenderness. Large central scar.  Abdomen: +BS, soft, NT, ND Skin: WWP. Large ecchymosis on right flank.  Neuro: CNII-XII intact, 5- strength with right hand grip, Waddell's sign positive for left leg raise Psych: Flat affect, limited eye contact, soft speech  Labs and Imaging: CBC BMET   Recent Labs Lab 06/01/15 0943  WBC 9.1  HGB 12.6*  HCT 38.1*  PLT 499*    Recent Labs Lab 06/01/15 0943  NA 145  K 3.8  CL 109  CO2 25  BUN 6  CREATININE 1.48*  GLUCOSE 137*  CALCIUM 9.4      Casey Burkitt, MD 06/01/2015, 3:39 PM PGY-1, [facility] FPTS Intern pager: [phone], text pages welcome

## 2015-06-01 NOTE — ED Notes (Signed)
Pt presents via [facility] from [facility] and rehab c/o centralized CP beginning this AM at 0600, describing as tightness, no radiation no N/V/sob.   324 ASA given, on EMS arrival chest pain had resolved.  CP free on arrival.  Pt alert to voice, drowsy on arrival.  BP-180/110, P-75, EKG with ST elevation in V1, V2, V3.  EKG sent over by EMS prior to arrival.  20 g L hand.

## 2015-06-01 NOTE — Consult Note (Signed)
Requesting Physician: Dr.  Silverio Lay, and  Carolan Clines, PA; in ER    Reason for consultation: Altered mental status  HPI:                                                                                                                                         Donald Marks is an 55 y.o. male patient who presented with chest pain symptoms and was noted to have altered mental status, unresponsive to commands. Neurology service consulted for further evaluation. Patient known to have prior psychogenic spells, with recurrent admissions with similar presentation of mental status changes as described in previous neurology consultation notes.   Past Medical History: Past Medical History  Diagnosis Date  . MI (myocardial infarction) (HCC)   . Diabetes mellitus without complication (HCC)   . Hypertension   . Coronary artery disease   . Seizure (HCC)   . CVA (cerebral infarction)   . CVA (cerebral vascular accident) (HCC) 05/2015    Past Surgical History  Procedure Laterality Date  . Coronary artery bypass graft    . Cardiac catheterization    . Cholecystectomy    . Mouth surgery      Family History: Family History  Problem Relation Age of Onset  . Hyperlipidemia Mother   . Hypertension Father   . Hypertension Mother   . Hyperlipidemia Father   . Aneurysm Mother   . Aneurysm Maternal Grandmother   . Hypertension Maternal Grandmother   . Diabetes Father     Social History:   reports that he has never smoked. He has never used smokeless tobacco. He reports that he does not drink alcohol or use illicit drugs.  Allergies:  Allergies  Allergen Reactions  . Contrast Media [Iodinated Diagnostic Agents]      Medications:                                                                                                                        No current facility-administered medications for this encounter.  Current outpatient prescriptions:  .  amLODipine (NORVASC) 10 MG tablet, Take 10  mg by mouth daily., Disp: , Rfl:  .  atorvastatin (LIPITOR) 80 MG tablet, Take 80 mg by mouth daily., Disp: , Rfl:  .  Cholecalciferol (VITAMIN D3) 2000 units capsule, Take 2,000 Units by mouth daily., Disp: , Rfl:  .  clopidogrel (PLAVIX) 75 MG  tablet, Take 1 tablet (75 mg total) by mouth daily., Disp: 30 tablet, Rfl: 3 .  diazepam (DIASTAT) 2.5 MG GEL, Place 2.5 mg rectally once. for Seizure lasting longer than 5 minutes. Please do not use more than 5 times in a month., Disp: 5 Package, Rfl: 0 .  insulin glargine (LANTUS) 100 UNIT/ML injection, Inject 0.1 mLs (10 Units total) into the skin at bedtime. Patient uses sliding scale, Disp: 10 mL, Rfl:  .  insulin lispro (HUMALOG) 100 UNIT/ML injection, Give 5 units if blood sugar is greater than 150, Disp: , Rfl:  .  lamoTRIgine (LAMICTAL) 150 MG tablet, Take 1 tablet (150 mg total) by mouth 2 (two) times daily., Disp: 60 tablet, Rfl:  .  lisinopril (PRINIVIL,ZESTRIL) 5 MG tablet, Take 1 tablet (5 mg total) by mouth daily., Disp: 30 tablet, Rfl: 0 .  magnesium oxide (MAG-OX) 400 MG tablet, Take 400 mg by mouth daily., Disp: , Rfl:    ROS:                                                                                                                                       History unobtainable from patient due to lack of cooperation    Neurologic Examination:                                                                                                      Blood pressure 180/97, pulse 73, temperature 98.1 F (36.7 C), temperature source Oral, resp. rate 13, SpO2 97 %.   patient was initially  Staring straight ahead , unresponsive to verbal commands,  Lying in his bed.   he did not Respond or move to any tactile stimulus.  With the use of ammonia inhalational  respiratory stimulant, he immediately became very responsive,  Alert, Was able to answer questions,  Oriented 4.  Cranial nerve exam is interictal grossly intact  Able to demonstrate full  strength in all 4 extremities.  Deep tendon reflexes are 2+ and normal  No involuntary movements are noted in the limbs   Lab Results: Basic Metabolic Panel:  Recent Labs Lab 06/01/15 0943  NA 145  K 3.8  CL 109  CO2 25  GLUCOSE 137*  BUN 6  CREATININE 1.48*  CALCIUM 9.4    Liver Function Tests:  Recent Labs Lab 06/01/15 1028  AST 20  ALT 21  ALKPHOS 108  BILITOT 0.4  PROT 7.2  ALBUMIN 3.5   No results for input(s): LIPASE,  AMYLASE in the last 168 hours. No results for input(s): AMMONIA in the last 168 hours.  CBC:  Recent Labs Lab 06/01/15 0943  WBC 9.1  HGB 12.6*  HCT 38.1*  MCV 88.2  PLT 499*    Cardiac Enzymes: No results for input(s): CKTOTAL, CKMB, CKMBINDEX, TROPONINI in the last 168 hours.  Lipid Panel: No results for input(s): CHOL, TRIG, HDL, CHOLHDL, VLDL, LDLCALC in the last 168 hours.  CBG:  Recent Labs Lab 06/01/15 0946  GLUCAP 123*    Microbiology: Results for orders placed or performed during the hospital encounter of 05/16/15  MRSA PCR Screening     Status: Abnormal   Collection Time: 05/17/15  7:59 AM  Result Value Ref Range Status   MRSA by PCR POSITIVE (A) NEGATIVE Final    Comment:        The GeneXpert MRSA Assay (FDA approved for NASAL specimens only), is one component of a comprehensive MRSA colonization surveillance program. It is not intended to diagnose MRSA infection nor to guide or monitor treatment for MRSA infections. RESULT CALLED TO, READ BACK BY AND VERIFIED WITH: Karrie Doffing RN 12:10 05/17/15 ([email or web address])      Imaging: Dg Chest 2 View  06/01/2015  CLINICAL DATA:  Lethargy.  Reported chest pain. EXAM: CHEST  2 VIEW COMPARISON:  May 21, 2015 FINDINGS: There is no edema or consolidation. Heart is mildly enlarged with pulmonary vascularity within normal limits. Patient is status post coronary artery bypass grafting. No adenopathy. No bone lesions. IMPRESSION: Mild cardiac enlargement.  No edema or  consolidation. Electronically Signed   By: Bretta Bang III M.D.   On: 06/01/2015 09:38   Ct Head Wo Contrast  06/01/2015  CLINICAL DATA:  Seizure earlier today.  Altered mental status. EXAM: CT HEAD WITHOUT CONTRAST TECHNIQUE: Contiguous axial images were obtained from the base of the skull through the vertex without intravenous contrast. COMPARISON:  Head CT May 16, 2015; brain MRI May 17, 2015 FINDINGS: The ventricles are normal in size and configuration. There is no intracranial mass, hemorrhage, extra-axial fluid collection, or midline shift. Prior small infarcts in the centra semiovale bilaterally immediately adjacent to the respective lateral ventricles appear stable. There is patchy small vessel disease in the centra semiovale bilaterally, stable. There is evidence of a prior infarct in the right basal ganglia involving and immediately lateral to the genu of the right internal capsule. There is no new gray-white compartment lesion. No acute infarct is evident. The bony calvarium appears intact. The mastoid air cells are clear. No intraorbital lesions are identified. There is mucosal thickening in the posterior maxillary antra, more on the left than on the right, stable. There is rightward deviation of the nasal septum, stable. IMPRESSION: Prior small periventricular white matter and right basal ganglia infarcts. Mild periventricular small vessel disease. No acute infarct is evident by CT. No hemorrhage or mass effect. Mild maxillary sinus disease present. Electronically Signed   By: Bretta Bang III M.D.   On: 06/01/2015 10:50    Assessment and plan:   Donald Marks is an 55 y.o. male patient who presented with chest pain and noted to have altered mental status and unresponsiveness. He responded appropriately after being aroused with ammonia inhalation  Stimulant, was oriented, followed commands appropriately, with grossly nonfocal neurological examination. He has a known history  of psychogenic spells, and this unresponsive episode at this time also appears to be psychogenic. No further neurodiagnostic testing recommended at this time. Recommend psychiatric consultation to further  evaluate for underlying stressors contributing to his recurrent episodes of conversion disorder.  His chest pain evaluation and management deferred to ER physician and admitting hospitalist team.   Will sign off. Please call for any further questions.

## 2015-06-01 NOTE — ED Provider Notes (Signed)
CSN: [id number]     Arrival date & time 06/01/15  4098 History   First MD Initiated Contact with Patient 06/01/15 [phone]     Chief Complaint  Patient presents with  . Chest Pain    HPI   Donald Marks is a 55 y.o. male with a PMH of DM, HTN, CAD, MI, CVA who presents to the ED with left-sided chest pain, which he states woke him from sleep around 6:30 this morning. He reports his pain was constant, however is now resolved. He denies radiation. He denies exacerbating factors. He states he was given a full dose aspirin by EMS with subsequent symptom relief. He notes associated dizziness and lightheadedness. He denies fever, chills, shortness of breath, abdominal pain, nausea, vomiting, numbness, paresthesia. He notes left-sided weakness, which he states is unchanged from baseline.   Past Medical History  Diagnosis Date  . MI (myocardial infarction) (HCC)   . Diabetes mellitus without complication (HCC)   . Hypertension   . Coronary artery disease   . Seizure (HCC)   . CVA (cerebral infarction)   . CVA (cerebral vascular accident) (HCC) 05/2015   Past Surgical History  Procedure Laterality Date  . Coronary artery bypass graft    . Cardiac catheterization    . Cholecystectomy    . Mouth surgery     Family History  Problem Relation Age of Onset  . Hyperlipidemia Mother   . Hypertension Father   . Hypertension Mother   . Hyperlipidemia Father   . Aneurysm Mother   . Aneurysm Maternal Grandmother   . Hypertension Maternal Grandmother   . Diabetes Father    Social History  Substance Use Topics  . Smoking status: Never Smoker   . Smokeless tobacco: Never Used  . Alcohol Use: No      Review of Systems  Constitutional: Negative for fever and chills.  Respiratory: Negative for shortness of breath.   Cardiovascular: Positive for chest pain.  Gastrointestinal: Negative for nausea, vomiting and abdominal pain.  Neurological: Positive for dizziness, weakness and light-headedness.  Negative for numbness.  All other systems reviewed and are negative.     Allergies  Contrast media  Home Medications   Prior to Admission medications   Medication Sig Start Date End Date Taking? Authorizing Provider  amLODipine (NORVASC) 10 MG tablet Take 10 mg by mouth daily.    Historical Provider, MD  atorvastatin (LIPITOR) 80 MG tablet Take 80 mg by mouth daily.    Historical Provider, MD  Cholecalciferol (VITAMIN D3) 2000 units capsule Take 2,000 Units by mouth daily.    Historical Provider, MD  clopidogrel (PLAVIX) 75 MG tablet Take 1 tablet (75 mg total) by mouth daily. 05/16/15   Beaulah Dinning, MD  diazepam (DIASTAT) 2.5 MG GEL Place 2.5 mg rectally once. for Seizure lasting longer than 5 minutes. Please do not use more than 5 times in a month. 05/19/15   [name], MD  insulin glargine (LANTUS) 100 UNIT/ML injection Inject 0.1 mLs (10 Units total) into the skin at bedtime. Patient uses sliding scale 05/19/15   [name], MD  insulin lispro (HUMALOG) 100 UNIT/ML injection Give 5 units if blood sugar is greater than 150    Historical Provider, MD  lamoTRIgine (LAMICTAL) 150 MG tablet Take 1 tablet (150 mg total) by mouth 2 (two) times daily. 05/19/15   [name], MD  lisinopril (PRINIVIL,ZESTRIL) 5 MG tablet Take 1 tablet (5 mg total) by mouth daily. 05/19/15  [name], MD  magnesium oxide (MAG-OX) 400 MG tablet Take 400 mg by mouth daily.    Historical Provider, MD    BP 179/102 mmHg  Pulse 72  Temp(Src) 98.1 F (36.7 C) (Oral)  Resp 15  SpO2 98% Physical Exam  Constitutional: He appears well-developed and well-nourished. No distress.  HENT:  Head: Normocephalic and atraumatic.  Right Ear: External ear normal.  Left Ear: External ear normal.  Nose: Nose normal.  Mouth/Throat: Uvula is midline, oropharynx is clear and moist and mucous membranes are normal.  Eyes: Conjunctivae, EOM and lids are normal. Pupils are equal, round,  and reactive to light. Right eye exhibits no discharge. Left eye exhibits no discharge. No scleral icterus.  Neck: Normal range of motion. Neck supple.  Cardiovascular: Normal rate, regular rhythm, normal heart sounds, intact distal pulses and normal pulses.   Pulmonary/Chest: Effort normal and breath sounds normal. No respiratory distress. He has no wheezes. He has no rales.  Abdominal: Soft. Normal appearance and bowel sounds are normal. He exhibits no distension and no mass. There is no tenderness. There is no rigidity, no rebound and no guarding.  Musculoskeletal: Normal range of motion. He exhibits no edema or tenderness.  Neurological: No sensory deficit.  Patient appears drowsy. He is oriented to person and to year. Strength 5 out of 5 right upper extremity and right lower extremity. Strength 4 out of 5 left upper extremity and left lower extremity.  Skin: Skin is warm, dry and intact. No rash noted. He is not diaphoretic. No erythema. No pallor.  Psychiatric: He has a normal mood and affect. His speech is normal and behavior is normal.  Nursing note and vitals reviewed.   ED Course  Procedures (including critical care time)  Labs Review Labs Reviewed  BASIC METABOLIC PANEL - Abnormal; Notable for the following:    Glucose, Bld 137 (*)    Creatinine, Ser 1.48 (*)    GFR calc non Af Amer 52 (*)    All other components within normal limits  CBC - Abnormal; Notable for the following:    Hemoglobin 12.6 (*)    HCT 38.1 (*)    Platelets 499 (*)    All other components within normal limits  URINALYSIS, ROUTINE W REFLEX MICROSCOPIC (NOT AT [facility]) - Abnormal; Notable for the following:    Hgb urine dipstick TRACE (*)    Protein, ur >300 (*)    All other components within normal limits  HEPATIC FUNCTION PANEL - Abnormal; Notable for the following:    Bilirubin, Direct <0.1 (*)    All other components within normal limits  URINE MICROSCOPIC-ADD ON - Abnormal; Notable for the following:     Squamous Epithelial / LPF 0-5 (*)    Bacteria, UA FEW (*)    Casts HYALINE CASTS (*)    All other components within normal limits  CBG MONITORING, ED - Abnormal; Notable for the following:    Glucose-Capillary 123 (*)    All other components within normal limits  URINE RAPID DRUG SCREEN, HOSP PERFORMED  I-STAT TROPOININ, ED  Rosezena Sensor, ED    Imaging Review Dg Chest 2 View  06/01/2015  CLINICAL DATA:  Lethargy.  Reported chest pain. EXAM: CHEST  2 VIEW COMPARISON:  May 21, 2015 FINDINGS: There is no edema or consolidation. Heart is mildly enlarged with pulmonary vascularity within normal limits. Patient is status post coronary artery bypass grafting. No adenopathy. No bone lesions. IMPRESSION: Mild cardiac enlargement.  No edema  or consolidation. Electronically Signed   By: Bretta Bang III M.D.   On: 06/01/2015 09:38   Ct Head Wo Contrast  06/01/2015  CLINICAL DATA:  Seizure earlier today.  Altered mental status. EXAM: CT HEAD WITHOUT CONTRAST TECHNIQUE: Contiguous axial images were obtained from the base of the skull through the vertex without intravenous contrast. COMPARISON:  Head CT May 16, 2015; brain MRI May 17, 2015 FINDINGS: The ventricles are normal in size and configuration. There is no intracranial mass, hemorrhage, extra-axial fluid collection, or midline shift. Prior small infarcts in the centra semiovale bilaterally immediately adjacent to the respective lateral ventricles appear stable. There is patchy small vessel disease in the centra semiovale bilaterally, stable. There is evidence of a prior infarct in the right basal ganglia involving and immediately lateral to the genu of the right internal capsule. There is no new gray-white compartment lesion. No acute infarct is evident. The bony calvarium appears intact. The mastoid air cells are clear. No intraorbital lesions are identified. There is mucosal thickening in the posterior maxillary antra, more on  the left than on the right, stable. There is rightward deviation of the nasal septum, stable. IMPRESSION: Prior small periventricular white matter and right basal ganglia infarcts. Mild periventricular small vessel disease. No acute infarct is evident by CT. No hemorrhage or mass effect. Mild maxillary sinus disease present. Electronically Signed   By: Bretta Bang III M.D.   On: 06/01/2015 10:50   I have personally reviewed and evaluated these images and lab results as part of my medical decision-making.   EKG Interpretation   Date/Time:  Thursday June 01 2015 08:40:03 EST Ventricular Rate:  75 PR Interval:  168 QRS Duration: 117 QT Interval:  408 QTC Calculation: 456 R Axis:   47 Text Interpretation:  Sinus rhythm LVH with secondary repolarization  abnormality Anterior ST elevation, probably due to LVH No significant  change since last tracing Confirmed by YAO  MD, DAVID ([id number]) on 06/01/2015  9:07:05 AM      MDM   Final diagnoses:  Altered mental status  Chest pain, unspecified chest pain type    55 year old male presents with chest pain, which started this morning and is now resolved s/p ASA administration. Patient has a significant cardiac history and per record review, had an MI in January. He follows with cardiology at the [place].  Patient is afebrile. Hypertensive. Heart regular rate and rhythm. Lungs clear to auscultation bilaterally. Abdomen soft, nontender, nondistended. Left-sided weakness present, which patient states is consistent with his baseline.   EKG sinus rhythm, heart rate 75. Troponin negative. Chest x-ray mild cardiac enlargement, no edema or consolidation.  CBC negative for leukocytosis, hemoglobin 12.6. BMP remarkable for creatinine 1.48. Hepatic function panel unremarkable.  Per nursing facility, patient is typically interactive and performs many of his ADLs independently. He seems to be altered from his baseline. Per nursing, he has had several episodes  of "head twitching" in the ED. On my assessment, I have not witnessed these symptoms, however the patient does appear drowsy. Head CT obtained, which reveals prior small periventricular white matter and right basal ganglia infarcts, mild paraventricular small vessel disease, no acute infarct, no hemorrhage or mass effect.  Patient recently evaluated in the ED for seizure. Per neurology consult note, symptoms seem to be consistent with nonepileptic spells. Neurology consulted. Spoke with Dr. Lavon Paganini, who will see the patient in the ED. Family medicine consulted. Spoke with family medicine resident, patient to be admitted to Dr. Gwendolyn Grant  for further evaluation and management.   After his assessment, Dr. Lavon Paganini advised no neurologic issues, though patient would benefit from psych consult once admitted.   Patient discussed with and seen by Dr. Silverio Lay.  BP 179/102 mmHg  Pulse 72  Temp(Src) 98.1 F (36.7 C) (Oral)  Resp 15  SpO2 98%  Mady Gemma, PA-C 06/01/15 1407  Richardean Canal, MD 06/01/15 [phone]

## 2015-06-01 NOTE — ED Notes (Signed)
Neuro at bedside.

## 2015-06-01 NOTE — Consult Note (Signed)
CARDIOLOGY CONSULT NOTE  Patient ID: Donald Marks MRN: [id number] DOB/AGE: [birth year] 55 y.o.  Admit date: 06/01/2015 Referring Physician  Dani Gobble, MD Primary Physician:  Sondra Come, MD Reason for Consultation  Chest pain  HPI: Donald Marks  is a 55 y.o. male  With prior stroke, He states that he has had a stroke when he initially presented with elevated blood pressure on 04/30/2015 in [place], [place] and was also told to have MI at the same time. He has history of CABG in [address] in [facility], [place] and has been doing well. He has had a second stroke in February 2017 at [facility] evaluated and treated with left-sided hemiparesis.  He is presently in the rehabilitation program at [facility] I discussed, this morning woke up and complained of chest discomfort in the left side of the chest. He was sent total emergency room for follow-up and management.. Patient describes chest pain as sharp pain in the left upper part of the chest and points with this one finger and states that it hurts if he takes a deep breath. There is no radiation of the pain. No other associated symptoms. Since this morning until now he still has the pain when he takes deep breath. Denies any shortness of breath, no PND or orthopnea. He has had significant gait instability since stroke and states that he did have 2 falls 2 nights ago and 3 nights ago. Presently otherwise states that he is doing well. Denies any headache, visual disturbances, hemoptysis, leg edema or symptoms suggestive of claudication.   Past Medical History  Diagnosis Date  . MI (myocardial infarction) (HCC) 04/30/2015 with stroke  . Diabetes mellitus without complication (HCC)   . Hypertension   . Coronary artery disease CABG  2015 x 3    [place]  [facility]  . Seizure (HCC)   . CVA (cerebral infarction) 04/30/2015 in [place], [place]  . CVA (cerebral vascular accident) (HCC) 05/2015     Past  Surgical History  Procedure Laterality Date  . Coronary artery bypass graft    . Cardiac catheterization    . Cholecystectomy    . Mouth surgery       Family History  Problem Relation Age of Onset  . Hyperlipidemia Mother   . Hypertension Father   . Hypertension Mother   . Hyperlipidemia Father   . Aneurysm Mother   . Aneurysm Maternal Grandmother   . Hypertension Maternal Grandmother   . Diabetes Father      Social History: Social History   Social History  . Marital Status: Single    Spouse Name: N/A  . Number of Children: N/A  . Years of Education: N/A   Occupational History  . Not on file.   Social History Main Topics  . Smoking status: Never Smoker   . Smokeless tobacco: Never Used  . Alcohol Use: No  . Drug Use: No  . Sexual Activity: Not on file   Other Topics Concern  . Not on file   Social History Narrative     Prescriptions prior to admission  Medication Sig Dispense Refill Last Dose  . amLODipine (NORVASC) 10 MG tablet Take 10 mg by mouth daily.   05/31/2015 at Unknown time  . aspirin 81 MG chewable tablet Chew 81 mg by mouth daily as needed for mild pain.   06/01/2015 at Unknown time  . atorvastatin (LIPITOR) 80 MG tablet Take 80 mg by mouth daily.   05/31/2015 at Unknown time  .  Cholecalciferol (VITAMIN D3) 2000 units capsule Take 2,000 Units by mouth daily.   05/31/2015 at Unknown time  . clopidogrel (PLAVIX) 75 MG tablet Take 1 tablet (75 mg total) by mouth daily. 30 tablet 3 05/31/2015 at Unknown time  . diazepam (DIASTAT) 2.5 MG GEL Place 2.5 mg rectally once. for Seizure lasting longer than 5 minutes. Please do not use more than 5 times in a month. 5 Package 0 unk at [employer]  . insulin glargine (LANTUS) 100 UNIT/ML injection Inject 0.1 mLs (10 Units total) into the skin at bedtime. Patient uses sliding scale 10 mL  05/31/2015 at Unknown time  . insulin lispro (HUMALOG) 100 UNIT/ML injection Give 5 units if blood sugar is greater than 150   Past Month at Unknown  time  . lamoTRIgine (LAMICTAL) 150 MG tablet Take 1 tablet (150 mg total) by mouth 2 (two) times daily. 60 tablet  05/31/2015 at Unknown time  . lisinopril (PRINIVIL,ZESTRIL) 5 MG tablet Take 1 tablet (5 mg total) by mouth daily. 30 tablet 0 05/31/2015 at Unknown time  . magnesium oxide (MAG-OX) 400 MG tablet Take 400 mg by mouth daily.   05/31/2015 at Unknown time     ROS: General: no fevers/chills/night sweats Eyes: no blurry vision, diplopia, or amaurosis ENT: no sore throat or hearing loss Resp: no cough, wheezing, or hemoptysis CV: no edema or palpitations GI: no abdominal pain, nausea, vomiting, diarrhea, or constipation GU: no dysuria, frequency, or hematuria Skin: no rash Neuro:  Left sided weakness of extremities Musculoskeletal: no joint pain or swelling, walks with walker but has unstable gait Heme: no bleeding, DVT, or easy bruising Endo: no polydipsia or polyuria    Physical Exam: Blood pressure 197/107, pulse 76, temperature 98.5 F (36.9 C), temperature source Oral, resp. rate 17, height  (1.549 m), weight 107.185 kg (236 lb 4.8 oz), SpO2 100 %.  Body mass index is 44.67 kg/(m^2).   General appearance: alert, cooperative, no distress and morbidly obese Lungs: clear to auscultation bilaterally Chest wall: no tenderness Heart: regular rate and rhythm, S1, S2 normal, no murmur, click, rub or gallop Abdomen: soft, non-tender; bowel sounds normal; no masses,  no organomegaly Extremities: extremities normal, atraumatic, no cyanosis or edema Pulses: 2+ and symmetric Bilateral soft carotid bruit present Neurologic: Mild left hemiparesis. Complete neuro exam not performed  Labs:   Lab Results  Component Value Date   WBC 9.1 06/01/2015   HGB 12.6* 06/01/2015   HCT 38.1* 06/01/2015   MCV 88.2 06/01/2015   PLT 499* 06/01/2015    Recent Labs Lab 06/01/15 0943 06/01/15 1028  NA 145  --   K 3.8  --   CL 109  --   CO2 25  --   BUN 6  --   CREATININE 1.48*  --    CALCIUM 9.4  --   PROT  --  7.2  BILITOT  --  0.4  ALKPHOS  --  108  ALT  --  21  AST  --  20  GLUCOSE 137*  --     Lipid Panel     Component Value Date/Time   CHOL 204* 05/12/2015 0320   TRIG 139 05/12/2015 0320   HDL 35* 05/12/2015 0320   CHOLHDL 5.8 05/12/2015 0320   VLDL 28 05/12/2015 0320   LDLCALC 141* 05/12/2015 0320    HEMOGLOBIN A1C Lab Results  Component Value Date   HGBA1C 10.5* 05/12/2015   MPG 255 05/12/2015    Cardiac Panel (last 3  results)  Recent Labs  05/12/15 0033  TROPONINI <0.03    Lab Results  Component Value Date   TROPONINI <0.03 05/12/2015    EKG 06/01/2015: Normal sinus rhythm at the rate of 75 bpm, LVH with repolarization abnormality, cannot exclude lateral ischemia.  Compared to the EKG done 05/16/2015, no significant change.  Echocardiogram 05/14/2015: Mild LVH, LVEF 60-65%, grade 2 diastolic dysfunction, left atrium severely dilated.  No other significant valvular abnormality.  Carotid duplex 05/12/2015: Bilateral ICA 1-39% stenosis.  Right vertebral not visualized, left vertebral patent with antegrade flow.  Transcranial bubble study revealing left carotid siphon mild stenosis, basilar artery moderate stenosis, anterior circulation not well visualized.   Radiology: CT scan of the head without contrast 06/01/2015: Prior small periventricular white matter and right basal ganglia infarcts. Mild periventricular small vessel disease. No acute infarct is evident by CT. No hemorrhage or mass effect. Mild maxillary sinus disease present.   Dg Chest 2 View 06/01/2015 CLINICAL DATA: Lethargy. Reported chest pain. EXAM: CHEST 2 VIEW COMPARISON: May 21, 2015 FINDINGS: There is no edema or consolidation. Heart is mildly enlarged with pulmonary vascularity within normal limits. Patient is status post coronary artery bypass grafting. No adenopathy. No bone lesions. IMPRESSION: Mild cardiac enlargement. No edema or consolidation.   Scheduled  Meds: . amLODipine  10 mg Oral Daily  . atorvastatin  80 mg Oral q1800  . cholecalciferol  2,000 Units Oral Daily  . clopidogrel  75 mg Oral Daily  . enoxaparin (LOVENOX) injection  40 mg Subcutaneous Q24H  . insulin aspart  0-9 Units Subcutaneous TID WC  . insulin glargine  10 Units Subcutaneous QHS  . lamoTRIgine  150 mg Oral BID  . lisinopril  5 mg Oral Daily   Continuous Infusions: . sodium chloride     PRN Meds:.acetaminophen, [START ON 06/02/2015] aspirin, ondansetron (ZOFRAN) IV  ASSESSMENT AND PLAN:  1. Musculoskeletal chest pain, is very reproducible on taking deep breath, unless troponins continued to elevate would recommend no further evaluation at this point as patient has had chest discomfort since morning with initial set of troponin negative. Await for the next level. 2. Hypertension with hypertensive heart disease. 3. Diabetes mellitus type 2 controlled 4. Morbid obesity 5. CAD and history of CABG 3 in 2015 in [place], [facility]. 6. Hyperlipidemia 7. History of stroke 2 in January and also February 2017. Initial presentation in January was told to have elevated blood pressure. 8. Stage III chronic kidney disease secondary to diabetes mellitus and hypertension.  Recommendation: Add carvedilol 12.5 mg by mouth twice a day for better control of hypertension. I'll use Ultram for pain control to see whether his symptoms would improve. I do not think that we need any further cardiac evaluation at this point as chest pain is clearly musculoskeletal. I suspect his presentation also be related to elevated blood pressure. I will also add hydrochlorothiazide 25 mg by mouth every morning. I'll see him in the morning. I will keep him nothing by mouth, if indeed troponin were positive, I will consider stress testing.  Yates Decamp, MD 06/01/2015, 3:56 PM [facility] Pager: [phone] Office: [phone] If no answer Cell [phone]

## 2015-06-01 NOTE — ED Notes (Signed)
Pt sister Porfirio Mylar updated on plan of care.

## 2015-06-02 DIAGNOSIS — Z794 Long term (current) use of insulin: Secondary | ICD-10-CM

## 2015-06-02 DIAGNOSIS — E1169 Type 2 diabetes mellitus with other specified complication: Secondary | ICD-10-CM

## 2015-06-02 DIAGNOSIS — I25709 Atherosclerosis of coronary artery bypass graft(s), unspecified, with unspecified angina pectoris: Secondary | ICD-10-CM | POA: Insufficient documentation

## 2015-06-02 DIAGNOSIS — R079 Chest pain, unspecified: Secondary | ICD-10-CM | POA: Diagnosis not present

## 2015-06-02 DIAGNOSIS — E119 Type 2 diabetes mellitus without complications: Secondary | ICD-10-CM | POA: Insufficient documentation

## 2015-06-02 DIAGNOSIS — I159 Secondary hypertension, unspecified: Secondary | ICD-10-CM | POA: Diagnosis not present

## 2015-06-02 LAB — GLUCOSE, CAPILLARY
GLUCOSE-CAPILLARY: 93 mg/dL (ref 65–99)
GLUCOSE-CAPILLARY: 87 mg/dL (ref 65–99)
GLUCOSE-CAPILLARY: 95 mg/dL (ref 65–99)

## 2015-06-02 MED ORDER — CARVEDILOL 25 MG PO TABS: 25.0000 mg | ORAL_TABLET | Freq: Two times a day (BID) | ORAL | Status: DC

## 2015-06-02 MED ORDER — HYDRALAZINE HCL 25 MG PO TABS: 25.0000 mg | ORAL_TABLET | Freq: Three times a day (TID) | ORAL | Status: AC

## 2015-06-02 MED ORDER — NITROGLYCERIN 0.4 MG SL SUBL: 0.4000 mg | SUBLINGUAL_TABLET | SUBLINGUAL | Status: AC | PRN

## 2015-06-02 MED ORDER — LISINOPRIL 10 MG PO TABS: 10.0000 mg | ORAL_TABLET | Freq: Every day | ORAL | Status: AC

## 2015-06-02 MED ORDER — CARVEDILOL 25 MG PO TABS: 25.0000 mg | ORAL_TABLET | Freq: Two times a day (BID) | ORAL | Status: AC

## 2015-06-02 MED ORDER — NITROGLYCERIN 0.4 MG SL SUBL: 0.4000 mg | SUBLINGUAL_TABLET | SUBLINGUAL | Status: DC | PRN

## 2015-06-02 MED ORDER — HYDROCHLOROTHIAZIDE 25 MG PO TABS: 25.0000 mg | ORAL_TABLET | Freq: Every day | ORAL | Status: AC

## 2015-06-02 MED ORDER — HYDRALAZINE HCL 25 MG PO TABS
25.0000 mg | ORAL_TABLET | Freq: Three times a day (TID) | ORAL | Status: DC
  Administered 2015-06-02: 25 mg via ORAL
  Filled 2015-06-02: qty 1

## 2015-06-02 MED ORDER — LISINOPRIL 10 MG PO TABS
10.0000 mg | ORAL_TABLET | Freq: Every day | ORAL | Status: DC
Start: 2015-06-03 — End: 2015-06-02
  Filled 2015-06-02: qty 1

## 2015-06-02 NOTE — Discharge Summary (Signed)
Family Medicine Teaching [facility] Discharge Summary  Patient name: Donald Marks Medical record number: [id number] Date of birth: 09/29/1960 Age: 55 y.o. Gender: male Date of Admission: 06/01/2015  Date of Discharge: 06/02/2015 Admitting Physician: Tobey Grim, MD  Primary Care Provider: Sondra Come, MD Consultants: Cardiology, Neurology  Indication for Hospitalization: chest pain  Discharge Diagnoses/Problem List:  Active Problems:   Chest pain   Altered mental status   Seizure-like episode  Disposition: SNF  Discharge Condition: Improved  Discharge Exam:  Blood pressure 148/90, pulse 72, temperature 98.6 F (37 C), temperature source Oral, resp. rate 17, height  (1.549 m), weight 236 lb 1.8 oz (107.1 kg), SpO2 99 %. General: Well-nourished male, resting in bed, talking on phone Eyes: PERRL, EOMI Cardiovascular: RRR, S1, S2, no m/r/g Chest: Lungs CTAB. No increased WOB. No chest wall tenderness. Large central scar.  Abdomen: +BS, soft, NT, ND Neuro: CNII-XII intact, 5- strength with right hand grip, Waddell's sign positive for left leg raise Psych: Flat affect, limited eye contact, soft speech  Brief Hospital Course:  Donald Marks is a 55 y.o. male with history of CAD s/p bypass, MI with stent placement, recent CVA with initial left-sided hemiparesis, T2DM, HTN, CKDIII, seizures and question of pancreatic cancer, who presented with acute onset chest pain x 45 minutes the morning of 06/01/15. In the ED, EKG was unchanged from prior tracing in February, with anterior ST elevation likely due to LVH. i-Stat tropinin was negative, as were subsequent troponins x 3. UDS negative.   He was admitted for chest pain work-up. Cardiology was consulted and recommended adding more anti-hypertensives for poorly controlled blood pressures that were recorded to be as high as 203/108. Chest pain was reproducible with deep breaths but resolved overnight. Due to musculoskeletal-type  pain and negative troponins, cardiology recommended no further work-up. Anti-hypertensives added were carvedilol 25 mg BID, hydralazine 25 mg TID, and HCTZ 25 mg daily. Lisinopril was also increased from 5 mg to 10 mg. Home amlodipine 10 mg daily was continued. Patient was discharged with prescription for nitroglycerin in case of future episodes of chest pain.   Of note, patient had a shaking spell in the ED concerning for seizure and was evaluated by Neurology who assessed the event as psychogenic, not requiring further work-up. Due to initial lack of response to commands, CT was performed to rule-out acute infarct and was negative.   Issues for Follow Up:  1. Continue to monitor blood pressures for adequate control on new agents.  Significant Procedures: None  Significant Labs and Imaging:   Recent Labs Lab 06/01/15 0943  WBC 9.1  HGB 12.6*  HCT 38.1*  PLT 499*    Recent Labs Lab 06/01/15 0943 06/01/15 1028  NA 145  --   K 3.8  --   CL 109  --   CO2 25  --   GLUCOSE 137*  --   BUN 6  --   CREATININE 1.48*  --   CALCIUM 9.4  --   ALKPHOS  --  108  AST  --  20  ALT  --  21  ALBUMIN  --  3.5    Results/Tests Pending at Time of Discharge: None  Discharge Medications:    Medication List    TAKE these medications        amLODipine 10 MG tablet  Commonly known as:  NORVASC  Take 10 mg by mouth daily.     aspirin 81 MG chewable tablet  Chew 81 mg  by mouth daily as needed for mild pain.     atorvastatin 80 MG tablet  Commonly known as:  LIPITOR  Take 80 mg by mouth daily.     carvedilol 25 MG tablet  Commonly known as:  COREG  Take 1 tablet (25 mg total) by mouth 2 (two) times daily with a meal.     clopidogrel 75 MG tablet  Commonly known as:  PLAVIX  Take 1 tablet (75 mg total) by mouth daily.     diazepam 2.5 MG Gel  Commonly known as:  DIASTAT  Place 2.5 mg rectally once. for Seizure lasting longer than 5 minutes. Please do not use more than 5 times in  a month.     hydrALAZINE 25 MG tablet  Commonly known as:  APRESOLINE  Take 1 tablet (25 mg total) by mouth 3 (three) times daily.     hydrochlorothiazide 25 MG tablet  Commonly known as:  HYDRODIURIL  Take 1 tablet (25 mg total) by mouth daily.     insulin glargine 100 UNIT/ML injection  Commonly known as:  LANTUS  Inject 0.1 mLs (10 Units total) into the skin at bedtime. Patient uses sliding scale     insulin lispro 100 UNIT/ML injection  Commonly known as:  HUMALOG  Give 5 units if blood sugar is greater than 150     lamoTRIgine 150 MG tablet  Commonly known as:  LAMICTAL  Take 1 tablet (150 mg total) by mouth 2 (two) times daily.     lisinopril 10 MG tablet  Commonly known as:  PRINIVIL,ZESTRIL  Take 1 tablet (10 mg total) by mouth daily.  Start taking on:  06/03/2015     magnesium oxide 400 MG tablet  Commonly known as:  MAG-OX  Take 400 mg by mouth daily.     nitroGLYCERIN 0.4 MG SL tablet  Commonly known as:  NITROSTAT  Place 1 tablet (0.4 mg total) under the tongue every 5 (five) minutes as needed for chest pain.     Vitamin D3 2000 units capsule  Take 2,000 Units by mouth daily.        Discharge Instructions: Please refer to Patient Instructions section of EMR for full details.  Patient was counseled important signs and symptoms that should prompt return to medical care, changes in medications, dietary instructions, activity restrictions, and follow up appointments.   Follow-Up Appointments: Follow-up Information    [facility],SAMUEL, MD.   Specialty:  Internal Medicine   Why:  for appointment once out of physical rehab   Contact information:   [address] [place] [facility] [phone]       Casey BurkittHillary Moen Fitzgerald, MD 06/02/2015, 2:34 PM PGY-1, [facility]

## 2015-06-02 NOTE — Progress Notes (Signed)
[facility] He is agreeable to this plan.  Non emergency ambulance arranged on behalf of pt.  Pollyann SavoyJody Shima [place], [place] [phone]

## 2015-06-02 NOTE — Progress Notes (Addendum)
Subjective:  Resting comfortably in bed, denies any recurrence of chest pain. Denies any new symptoms or concerns this morning.  Objective:  Vital Signs in the last 24 hours: Temp:  [98.1 F (36.7 C)-98.6 F (37 C)] 98.6 F (37 C) (03/03 0449) Pulse Rate:  [71-78] 72 (03/02 2038) Resp:  [12-20] 17 (03/02 1440) BP: (144-203)/(91-121) 144/93 mmHg (03/03 0700) SpO2:  [96 %-100 %] 99 % (03/02 2038) Weight:  [107.1 kg (236 lb 1.8 oz)-107.185 kg (236 lb 4.8 oz)] 107.1 kg (236 lb 1.8 oz) (03/03 0449)  Intake/Output from previous day: 03/02 0701 - 03/03 0700 In: 240 [P.O.:240] Out: 300 [Urine:300]  Physical Exam: General appearance: alert, cooperative, no distress and mildly obese Lungs: clear to auscultation bilaterally Chest wall: no tenderness Heart: regular rate and rhythm, S1, S2 normal, no murmur, click, rub or gallop Abdomen: soft, non-tender; bowel sounds normal; no masses, no organomegaly Extremities: extremities normal, atraumatic, no cyanosis or edema Pulses: 2+ and symmetric Bilateral soft carotid bruit present Neurologic: Mild left hemiparesis. Complete neuro exam not performed   Lab Results: BMP  Recent Labs  05/19/15 0612 05/21/15 1045 06/01/15 0943  NA 142 142 145  K 3.6 4.0 3.8  CL 109 109 109  CO2 24 22 25   GLUCOSE 102* 104* 137*  BUN 13 14 6   CREATININE 1.53* 1.71* 1.48*  CALCIUM 9.1 9.2 9.4  GFRNONAA 50* 44* 52*  GFRAA 58* 51* >60    CBC  Recent Labs Lab 06/01/15 0943  WBC 9.1  RBC 4.32  HGB 12.6*  HCT 38.1*  PLT 499*  MCV 88.2  MCH 29.2  MCHC 33.1  RDW 13.4    HEMOGLOBIN A1C Lab Results  Component Value Date   HGBA1C 10.5* 05/12/2015   MPG 255 05/12/2015    Cardiac Panel (last 3 results)  Recent Labs  06/01/15 0220 06/01/15 1620 06/01/15 1845  TROPONINI <0.03 <0.03 <0.03    BNP (last 3 results) No results for input(s): PROBNP in the last 8760 hours.  TSH No results for input(s): TSH in the last 8760  hours.  CHOLESTEROL  Recent Labs  05/12/15 0320  CHOL 204*    Hepatic Function Panel  Recent Labs  05/11/15 1135 05/16/15 2333 06/01/15 1028  PROT 6.1* 6.9 7.2  ALBUMIN 2.9* 3.3* 3.5  AST 21 33 20  ALT 13* 27 21  ALKPHOS 90 111 108  BILITOT 0.2* 0.5 0.4  BILIDIR  --   --  <0.1*  IBILI  --   --  NOT CALCULATED    Imaging: Dg Chest 2 View  06/01/2015  CLINICAL DATA:  Lethargy.  Reported chest pain. EXAM: CHEST  2 VIEW COMPARISON:  May 21, 2015 FINDINGS: There is no edema or consolidation. Heart is mildly enlarged with pulmonary vascularity within normal limits. Patient is status post coronary artery bypass grafting. No adenopathy. No bone lesions. IMPRESSION: Mild cardiac enlargement.  No edema or consolidation. Electronically Signed   By: Bretta BangWilliam  Woodruff III M.D.   On: 06/01/2015 09:38   Ct Head Wo Contrast  06/01/2015  CLINICAL DATA:  Seizure earlier today.  Altered mental status. EXAM: CT HEAD WITHOUT CONTRAST TECHNIQUE: Contiguous axial images were obtained from the base of the skull through the vertex without intravenous contrast. COMPARISON:  Head CT May 16, 2015; brain MRI May 17, 2015 FINDINGS: The ventricles are normal in size and configuration. There is no intracranial mass, hemorrhage, extra-axial fluid collection, or midline shift. Prior small infarcts in the centra semiovale bilaterally immediately  adjacent to the respective lateral ventricles appear stable. There is patchy small vessel disease in the centra semiovale bilaterally, stable. There is evidence of a prior infarct in the right basal ganglia involving and immediately lateral to the genu of the right internal capsule. There is no new gray-white compartment lesion. No acute infarct is evident. The bony calvarium appears intact. The mastoid air cells are clear. No intraorbital lesions are identified. There is mucosal thickening in the posterior maxillary antra, more on the left than on the right,  stable. There is rightward deviation of the nasal septum, stable. IMPRESSION: Prior small periventricular white matter and right basal ganglia infarcts. Mild periventricular small vessel disease. No acute infarct is evident by CT. No hemorrhage or mass effect. Mild maxillary sinus disease present. Electronically Signed   By: Bretta Bang III M.D.   On: 06/01/2015 10:50    Cardiac Studies: EKG 06/01/2015: Normal sinus rhythm at the rate of 75 bpm, LVH with repolarization abnormality, cannot exclude lateral ischemia. Compared to the EKG done 05/16/2015, no significant change.  Echocardiogram 05/14/2015: Mild LVH, LVEF 60-65%, grade 2 diastolic dysfunction, left atrium severely dilated. No other significant valvular abnormality.  Carotid duplex 05/12/2015: Bilateral ICA 1-39% stenosis. Right vertebral not visualized, left vertebral patent with antegrade flow. Transcranial bubble study revealing left carotid siphon mild stenosis, basilar artery moderate stenosis, anterior circulation not well visualized.   Assessment/Plan:  1. Musculoskeletal chest pain, was reproducible on taking deep breath, no elevation in trop, no EKG changes 2. Hypertension with hypertensive heart disease. 3. Diabetes mellitus type 2 controlled 4. Obesity 5. CAD and history of CABG 3 in 2015 in [place], [facility]. 6. Hyperlipidemia 7. History of stroke 2 in January and also February 2017. Initial presentation in January was told to have elevated blood pressure. 8. Stage III chronic kidney disease secondary to diabetes mellitus and hypertension.  Recommendation: Delta troponins negative. Pt denies any further recurrence of chest pain throughout the night or today. Blood pressure remains elevated but improving.   Erling Conte, NP-C 06/02/2015, 8:26 AM [facility] Pager: [phone] Office: [phone] If no answer: [phone]   Increase his carvedilol from 12.5 mg by mouth  twice a day to 25 mg by mouth twice a day. Will increase lisinopril from 5 mg to 10 mg. Hydralazine 25 mg TID. Would also add sublingual nitroglycerin to be given when necessary. He can be discharged from cardiac standpoint when medically stable. I have personally reviewed the patient's record and performed physical exam and agree with the assessment and plan of Ms. Marcy Salvo, NP-C.

## 2015-06-02 NOTE — Discharge Instructions (Signed)
Mr. Ryheem, Jay were admitted for chest pain and to rule out a dangerous cardiac event. Your heart enzymes were normal, as was your EKG, so pain is not believed to have been due to a heart attack. Pain may have been musculoskeletal, as it worsened with deep breaths. If you experience chest pain in the future, we have prescribed sublingual nitroglycerin. Do not take more than 3 in a 15 minute period.    Your blood pressures, however, were very elevated, so cardiology recommended adding a couple new medications: carvedilol and hydralazine. They also recommended increasing lisinopril dose.  Nitroglycerin sublingual tablets What is this medicine? NITROGLYCERIN (nye troe GLI ser in) is a type of vasodilator. It relaxes blood vessels, increasing the blood and oxygen supply to your heart. This medicine is used to relieve chest pain caused by angina. It is also used to prevent chest pain before activities like climbing stairs, going outdoors in cold weather, or sexual activity. This medicine may be used for other purposes; ask your health care provider or pharmacist if you have questions. What should I tell my health care provider before I take this medicine? They need to know if you have any of these conditions: -anemia -head injury, recent stroke, or bleeding in the brain -liver disease -previous heart attack -an unusual or allergic reaction to nitroglycerin, other medicines, foods, dyes, or preservatives -pregnant or trying to get pregnant -breast-feeding How should I use this medicine? Take this medicine by mouth as needed. At the first sign of an angina attack (chest pain or tightness) place one tablet under your tongue. You can also take this medicine 5 to 10 minutes before an event likely to produce chest pain. Follow the directions on the prescription label. Let the tablet dissolve under the tongue. Do not swallow whole. Replace the dose if you accidentally swallow it. It will help if your mouth  is not dry. Saliva around the tablet will help it to dissolve more quickly. Do not eat or drink, smoke or chew tobacco while a tablet is dissolving. If you are not better within 5 minutes after taking ONE dose of nitroglycerin, call 9-1-1 immediately to seek emergency medical care. Do not take more than 3 nitroglycerin tablets over 15 minutes. If you take this medicine often to relieve symptoms of angina, your doctor or health care professional may provide you with different instructions to manage your symptoms. If symptoms do not go away after following these instructions, it is important to call 9-1-1 immediately. Do not take more than 3 nitroglycerin tablets over 15 minutes. Talk to your pediatrician regarding the use of this medicine in children. Special care may be needed. Overdosage: If you think you have taken too much of this medicine contact a poison control center or emergency room at once. NOTE: This medicine is only for you. Do not share this medicine with others. What if I miss a dose? This does not apply. This medicine is only used as needed. What may interact with this medicine? Do not take this medicine with any of the following medications: -certain migraine medicines like ergotamine and dihydroergotamine (DHE) -medicines used to treat erectile dysfunction like sildenafil, tadalafil, and vardenafil -riociguat This medicine may also interact with the following medications: -alteplase -aspirin -heparin -medicines for high blood pressure -medicines for mental depression -other medicines used to treat angina -phenothiazines like chlorpromazine, mesoridazine, prochlorperazine, thioridazine This list may not describe all possible interactions. Give your health care provider a list of all the medicines,  herbs, non-prescription drugs, or dietary supplements you use. Also tell them if you smoke, drink alcohol, or use illegal drugs. Some items may interact with your medicine. What should  I watch for while using this medicine? Tell your doctor or health care professional if you feel your medicine is no longer working. Keep this medicine with you at all times. Sit or lie down when you take your medicine to prevent falling if you feel dizzy or faint after using it. Try to remain calm. This will help you to feel better faster. If you feel dizzy, take several deep breaths and lie down with your feet propped up, or bend forward with your head resting between your knees. You may get drowsy or dizzy. Do not drive, use machinery, or do anything that needs mental alertness until you know how this drug affects you. Do not stand or sit up quickly, especially if you are an older patient. This reduces the risk of dizzy or fainting spells. Alcohol can make you more drowsy and dizzy. Avoid alcoholic drinks. Do not treat yourself for coughs, colds, or pain while you are taking this medicine without asking your doctor or health care professional for advice. Some ingredients may increase your blood pressure. What side effects may I notice from receiving this medicine? Side effects that you should report to your doctor or health care professional as soon as possible: -blurred vision -dry mouth -skin rash -sweating -the feeling of extreme pressure in the head -unusually weak or tired Side effects that usually do not require medical attention (report to your doctor or health care professional if they continue or are bothersome): -flushing of the face or neck -headache -irregular heartbeat, palpitations -nausea, vomiting This list may not describe all possible side effects. Call your doctor for medical advice about side effects. You may report side effects to FDA at 1-800-FDA-1088. Where should I keep my medicine? Keep out of the reach of children. Store at room temperature between 20 and 25 degrees C (68 and 77 degrees F). Store in Retail buyeroriginal container. Protect from light and moisture. Keep tightly  closed. Throw away any unused medicine after the expiration date. NOTE: This sheet is a summary. It may not cover all possible information. If you have questions about this medicine, talk to your doctor, pharmacist, or health care provider.    2016, [employer]vier/[employer] Standard. (2013-01-14 17:57:36)

## 2015-06-02 NOTE — Progress Notes (Signed)
[facility]. I answered all questions. Pt is stable and awaiting transportation from [facility].  Colleen Canesar Ashari Llewellyn, RN

## 2015-06-05 ENCOUNTER — Encounter: Payer: Self-pay | Admitting: Adult Health

## 2015-06-05 ENCOUNTER — Non-Acute Institutional Stay (SKILLED_NURSING_FACILITY): Payer: Self-pay | Admitting: Adult Health

## 2015-06-05 DIAGNOSIS — E785 Hyperlipidemia, unspecified: Secondary | ICD-10-CM

## 2015-06-05 DIAGNOSIS — E118 Type 2 diabetes mellitus with unspecified complications: Secondary | ICD-10-CM

## 2015-06-05 DIAGNOSIS — I639 Cerebral infarction, unspecified: Secondary | ICD-10-CM

## 2015-06-05 DIAGNOSIS — R112 Nausea with vomiting, unspecified: Secondary | ICD-10-CM

## 2015-06-05 DIAGNOSIS — Z951 Presence of aortocoronary bypass graft: Secondary | ICD-10-CM

## 2015-06-05 DIAGNOSIS — I25709 Atherosclerosis of coronary artery bypass graft(s), unspecified, with unspecified angina pectoris: Secondary | ICD-10-CM

## 2015-06-05 DIAGNOSIS — R110 Nausea: Secondary | ICD-10-CM

## 2015-06-05 DIAGNOSIS — IMO0002 Reserved for concepts with insufficient information to code with codable children: Secondary | ICD-10-CM

## 2015-06-05 DIAGNOSIS — R569 Unspecified convulsions: Secondary | ICD-10-CM

## 2015-06-05 DIAGNOSIS — E1165 Type 2 diabetes mellitus with hyperglycemia: Secondary | ICD-10-CM

## 2015-06-05 DIAGNOSIS — Z794 Long term (current) use of insulin: Secondary | ICD-10-CM

## 2015-06-05 NOTE — Progress Notes (Signed)
Patient ID: Donald Marks, male   DOB: 06-05-1960, 55 y.o.   MRN: [id number]   Facility: [facility]       Allergies  Allergen Reactions  . Contrast Media [Iodinated Diagnostic Agents]     Chief Complaint  Patient presents with  . Hospitalization Follow-up     HPI:  He has been hospitalized for atypical chest pain. Cardiac causes were ruled out. The pain is felt to be M/S in origin. He is complaining of nausea and vomiting today stating he cannot keep solid food down; is able to keep liquids down. Staff reports that they have not seen ay evidence of vomiting and he has not told them he has had nausea. He is here for short term rehab at this time.   Past Medical History  Diagnosis Date  . MI (myocardial infarction) (HCC) 04/30/2015 with stroke  . Diabetes mellitus without complication (HCC)   . Hypertension   . Coronary artery disease CABG  2015 x 3    [place]  [facility]  . Seizure (HCC)   . CVA (cerebral infarction) 04/30/2015 in [place], [place]  . CVA (cerebral vascular accident) (HCC) 05/2015    Past Surgical History  Procedure Laterality Date  . Coronary artery bypass graft    . Cardiac catheterization    . Cholecystectomy    . Mouth surgery      VITAL SIGNS BP 180/100 mmHg  Pulse 75  Temp(Src) 98.3 F (36.8 C) (Oral)  Resp 19  Ht  (1.803 m)  Wt 244 lb (110.678 kg)  BMI 34.05 kg/m2  SpO2 94%  Patient's Medications  New Prescriptions   No medications on file  Previous Medications   AMLODIPINE (NORVASC) 10 MG TABLET    Take 10 mg by mouth daily.   ASPIRIN 81 MG CHEWABLE TABLET    Chew 81 mg by mouth daily as needed for mild pain.   ATORVASTATIN (LIPITOR) 80 MG TABLET    Take 80 mg by mouth daily.   CARVEDILOL (COREG) 25 MG TABLET    Take 1 tablet (25 mg total) by mouth 2 (two) times daily with a meal.   CHOLECALCIFEROL (VITAMIN D3) 2000 UNITS CAPSULE    Take 2,000 Units by mouth daily.   CLOPIDOGREL (PLAVIX) 75 MG TABLET    Take 1 tablet (75 mg  total) by mouth daily.   DIAZEPAM (DIASTAT) 2.5 MG GEL    Place 2.5 mg rectally once. for Seizure lasting longer than 5 minutes. Please do not use more than 5 times in a month.   HYDRALAZINE (APRESOLINE) 25 MG TABLET    Take 1 tablet (25 mg total) by mouth 3 (three) times daily.   HYDROCHLOROTHIAZIDE (HYDRODIURIL) 25 MG TABLET    Take 1 tablet (25 mg total) by mouth daily.   INSULIN GLARGINE (LANTUS) 100 UNIT/ML INJECTION    Inject 0.1 mLs (10 Units total) into the skin at bedtime. Patient uses sliding scale   INSULIN LISPRO (HUMALOG KWIKPEN) 100 UNIT/ML KIWKPEN    Inject into the skin. Inject as per sliding scale: if 0-149 =0 units; 150-399 = 5 units CBG 400 or greater call MD, subcutaneously before meals and at bedtime related to DM   LAMOTRIGINE (LAMICTAL) 150 MG TABLET    Take 1 tablet (150 mg total) by mouth 2 (two) times daily.   LISINOPRIL (PRINIVIL,ZESTRIL) 10 MG TABLET    Take 1 tablet (10 mg total) by mouth daily.   MAGNESIUM OXIDE (MAG-OX) 400 MG TABLET  Take 400 mg by mouth daily.   NITROGLYCERIN (NITROSTAT) 0.4 MG SL TABLET    Place 1 tablet (0.4 mg total) under the tongue every 5 (five) minutes as needed for chest pain.  Modified Medications   No medications on file  Discontinued Medications     SIGNIFICANT DIAGNOSTIC EXAMS  05-14-15: 2-d echo: - Left ventricle: There was mild concentric hypertrophy. Systolic function was normal. The estimated ejection fraction was in the range of 60% to 65%. Although no diagnostic regional wall motion abnormality was identified, this possibility cannot be completely excluded on the basis of this study. Features are consistent with a pseudonormal left ventricular filling pattern, with concomitant abnormal relaxation and increased filling pressure (grade   diastolic dysfunction). - Left atrium: The atrium was severely dilated.  05-16-15: right knee x-ray: 1. No acute osseous abnormality. 2. Small to moderate-sized joint effusion. 3. 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 4. 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 Periventricular and deep white matter chronic microvascular ischemic changes as well as a small focus of subacute/old infarct in the right periventricular white matter and right internal capsule.   05-17-15: EEG: This predominantly drowsy and asleep EEG is normal.    05-17-15: chest x-ray: Cardiomegaly with possible mild congestive changes. No focal consolidation  05-17-15: MRI of brain: No acute intracranial process. Evolving subacute RIGHT corona radiata subcentimeter infarct.  Old bilateral basal ganglia and thalamus lacunar infarcts. Moderate chronic small vessel ischemic disease, advanced for age though stable from prior imaging.  05-21-15; chest x-ray: Bibasilar atelectasis  05-22-15: right knee x-ray: joint effusion; no acute fracture   06-01-15: chest x-ray: Mild cardiac enlargement.  No edema or consolidation  06-01-15: ct of head: Prior small periventricular white matter and right basal ganglia infarcts. Mild periventricular small vessel disease. No acute infarct is evident by CT. No hemorrhage or mass effect. Mild maxillary sinus disease present.      LABS REVIEWED:   05-11-15: wbc 7.5; hb 12.;1 hct 36.6; mcv 88.0; plt 325; glucose 304; bun 20; creat 2.11; k+ 4.2; na++140; ast 13; total bili 0.2; albumin 2.9 05-12-15: wbc 8.2; hgb 10.5; hct 33.3; mcv 87.6; plt 316; glucose 165; bun 14; creat 1.45; k+ 3.8; na++142; hgb a1c 10.5; chol 204; ldl 141; trig 139; hdl 35 1-61-09: wbc 9.4; hgb 11.7; hct 36.3; mcv 89.9; plt 341; glucose 128; bun 15; creat 1.89; k+ 3.7; na++142; lamictal 10.0 05-21-15: wbc 9.6; hgb 11.2; hct 35.7;mcv 88.6; plt 386; glucose 104; bun 14; creat 1.71; k+ 4.0; na++142  06-01-15: wbc 9.1; hgb 12.6; hct 38.1; mcv 88.2; plt 499 ;glcuose 137; bun 6; creat 1.48; k+ 3.8; na++145;  liver normal albumin 3.5     Review of Systems  Constitutional: Negative for malaise/fatigue.  Respiratory: Negative for cough and shortness of breath.   Cardiovascular: Negative for chest pain, palpitations and leg swelling.  Gastrointestinal: Negative for heartburn, abdominal pain and constipation. has nausea and vomiting  Musculoskeletal: negative for joint pain . Negative for myalgias and back pain.   Skin: Negative.   Neurological: Negative for dizziness.  Psychiatric/Behavioral: The patient is not nervous/anxious.      Physical Exam  Constitutional: He is oriented to person, place, and time. No distress.  Eyes: Conjunctivae are normal.  Neck: Neck supple. No JVD present. No thyromegaly present.  Cardiovascular: Normal rate, regular rhythm and intact distal pulses.   Respiratory: Effort normal and  breath sounds normal. No respiratory distress. He has no wheezes.  GI: Soft. Bowel sounds are normal. He exhibits no distension. There is no tenderness.  Musculoskeletal: He exhibits no edema.  Able to move all extremities    Lymphadenopathy:    He has no cervical adenopathy.  Neurological: He is alert and oriented to person, place, and time.  Skin: Skin is warm and dry. He is not diaphoretic.  Psychiatric: He has a normal mood and affect.      ASSESSMENT/ PLAN:  1. Hypertension: will continue norvasc 10 mg daily; coreg 25 mg twice daily; hydralazine 25 mg three times daily; hctz 25 mg daily; lisinopril 10 mg daily; will monitor his status.   2. CVA: is neurologically stable; will continue asa 81 mg daily; plavix 75 mg daily; will continue therapy as directed  3. Diabetes: will continue lantus 10 units nightly humalog 5 units with meals for cbg >150-399; if >400; call MD hgb a1c 10.6  4. Dyslipidemia: will continue lipitor 80 mg daily ldl is 141  5. CAD; is status post CABG: will continue asa 81 mg daily plavix 75 mg has ntg prn   6. Seizure: no reports of seizure  activity; will continue lamictal 150 mg twice daily has valium 2.5 mg rectally for seizure activity   7. For his nausea and vomiting: will place him on a full liquid diet for 24 hours and advance will give zofran 4 mg every 8 hours as needed    Time spent with patient  50  minutes >50% time spent counseling; reviewing medical record; tests; labs; and developing future plan of care    Synthia Innocenteborah Jayvon Mounger NP [facility]  Contact [phone] Monday through Friday 8am- 5pm  After hours call [phone]

## 2015-06-06 ENCOUNTER — Non-Acute Institutional Stay (SKILLED_NURSING_FACILITY): Payer: Self-pay | Admitting: Internal Medicine

## 2015-06-06 ENCOUNTER — Encounter: Payer: Self-pay | Admitting: Internal Medicine

## 2015-06-06 DIAGNOSIS — R569 Unspecified convulsions: Secondary | ICD-10-CM

## 2015-06-06 DIAGNOSIS — Z794 Long term (current) use of insulin: Secondary | ICD-10-CM

## 2015-06-06 DIAGNOSIS — E785 Hyperlipidemia, unspecified: Secondary | ICD-10-CM

## 2015-06-06 DIAGNOSIS — IMO0002 Reserved for concepts with insufficient information to code with codable children: Secondary | ICD-10-CM

## 2015-06-06 DIAGNOSIS — R296 Repeated falls: Secondary | ICD-10-CM

## 2015-06-06 DIAGNOSIS — E118 Type 2 diabetes mellitus with unspecified complications: Secondary | ICD-10-CM

## 2015-06-06 DIAGNOSIS — I25709 Atherosclerosis of coronary artery bypass graft(s), unspecified, with unspecified angina pectoris: Secondary | ICD-10-CM

## 2015-06-06 DIAGNOSIS — I6930 Unspecified sequelae of cerebral infarction: Secondary | ICD-10-CM

## 2015-06-06 DIAGNOSIS — E1165 Type 2 diabetes mellitus with hyperglycemia: Secondary | ICD-10-CM

## 2015-06-06 DIAGNOSIS — I10 Essential (primary) hypertension: Secondary | ICD-10-CM

## 2015-06-06 NOTE — Progress Notes (Signed)
Patient ID: Donald Marks, male   DOB: Feb 09, 1961, 55 y.o.   MRN: [id number]    HISTORY AND PHYSICAL   DATE: 06/06/15  Location:  [facility]    Place of Service: SNF [phone])   Extended Emergency Contact Information Primary Emergency Contact: [place] Phone: [phone] Mobile Phone: [phone] Relation: Sister  Advanced Directive information Does patient have an advance directive?: No (Paper work not filled out )  Risk analyst Complaint  Patient presents with  . Readmit To SNF    HPI:  55 yo male seen today as a readmission into SNF following short hospital stay for atypical CP. CE neg x 3. Head CT neg in ED. Cr 1.48 at d/c. He was d/c'd back to SNF  He reports N/V on yesterday and unable to keep food down. Marland Kitchen He feels better today. He was placed on a clear liquid diet. No further CP. He is awaiting tx to [place] when bed becomes available. He is tolerating PT/OT but still has frequent falls. He fell this AM while transferring. Fall unwitnessed, no apparent injury.  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 No new sx's. Takes ASA and plavix  DM - uncontrolled with A1c 10.6%. CBG 119. He takes lantus 10 units nightly and humalog 5 units with meals for cbg >150-399; if >400. No low BS reactions  Dyslipidemia - stable on lipitor. LDL 141  CAD - s/p  CABG x 3 vessels in 2015. Takes ASA and plavix. No NTG use since return from hospital   Seizure - stable with lamictal 150 mg twice daily and prn valium 2.5 mg rectally for seizure activity. Na 145  N/V - prn zofran was rx on yesterday. No GERD sx's .  Past Medical History  Diagnosis Date  . MI (myocardial infarction) ([place]) 04/30/2015 with stroke  . Diabetes mellitus without complication ([place])   . Hypertension     . Coronary artery disease CABG  2015 x 3    [place]  . Seizure ([place])   . CVA (cerebral infarction) 04/30/2015 in [place], [place]  . CVA (cerebral vascular accident) ([place]) 05/2015    Past Surgical History  Procedure Laterality Date  . Coronary artery bypass graft    . Cardiac catheterization    . Cholecystectomy    . Mouth surgery      Patient Care Team: Windy Fast, MD as PCP - General (Internal Medicine)  Social History   Social History  . Marital Status: Single    Spouse Name: N/A  . Number of Children: N/A  . Years of Education: N/A   Occupational History  . Not on file.   Social History Main Topics  . Smoking status: Never Smoker   . Smokeless tobacco: Never Used  . Alcohol Use: No  . Drug Use: No  . Sexual Activity: Not on file   Other Topics Concern  . Not on file   Social History Narrative     reports that he has never smoked. He has never used smokeless tobacco. He reports that he does not drink alcohol or use illicit drugs.  Family History  Problem Relation Age of Onset  . Hyperlipidemia Mother   . Hypertension Father   . Hypertension  Mother   . Hyperlipidemia Father   . Aneurysm Mother   . Aneurysm Maternal Grandmother   . Hypertension Maternal Grandmother   . Diabetes Father    No family status information on file.    Immunization History  Administered Date(s) Administered  . Influenza,inj,Quad PF,36+ Mos 05/12/2015  . PPD Test 05/22/2015  . Pneumococcal Polysaccharide-23 05/12/2015    Allergies  Allergen Reactions  . Contrast Media [Iodinated Diagnostic Agents]     Medications: Patient's Medications  New Prescriptions   No medications on file  Previous Medications   AMLODIPINE (NORVASC) 10 MG TABLET    Take 10 mg by mouth daily.   ASPIRIN 81 MG CHEWABLE TABLET    Chew 81 mg by mouth daily as needed for mild pain.   ATORVASTATIN (LIPITOR) 80 MG TABLET    Take 80 mg by mouth daily.   CARVEDILOL (COREG) 25 MG  TABLET    Take 1 tablet (25 mg total) by mouth 2 (two) times daily with a meal.   CHOLECALCIFEROL (VITAMIN D3) 2000 UNITS CAPSULE    Take 2,000 Units by mouth daily.   CLOPIDOGREL (PLAVIX) 75 MG TABLET    Take 1 tablet (75 mg total) by mouth daily.   DIAZEPAM (DIASTAT) 2.5 MG GEL    Place 2.5 mg rectally once. for Seizure lasting longer than 5 minutes. Please do not use more than 5 times in a month.   HYDRALAZINE (APRESOLINE) 25 MG TABLET    Take 1 tablet (25 mg total) by mouth 3 (three) times daily.   HYDROCHLOROTHIAZIDE (HYDRODIURIL) 25 MG TABLET    Take 1 tablet (25 mg total) by mouth daily.   INSULIN GLARGINE (LANTUS) 100 UNIT/ML INJECTION    Inject 0.1 mLs (10 Units total) into the skin at bedtime. Patient uses sliding scale   INSULIN LISPRO (HUMALOG KWIKPEN) 100 UNIT/ML KIWKPEN    Inject into the skin. Inject as per sliding scale: if 0-149 =0 units; 150-399 = 5 units CBG 400 or greater call MD, subcutaneously before meals and at bedtime related to DM   LAMOTRIGINE (LAMICTAL) 150 MG TABLET    Take 1 tablet (150 mg total) by mouth 2 (two) times daily.   LISINOPRIL (PRINIVIL,ZESTRIL) 10 MG TABLET    Take 1 tablet (10 mg total) by mouth daily.   MAGNESIUM OXIDE (MAG-OX) 400 MG TABLET    Take 400 mg by mouth daily.   NITROGLYCERIN (NITROSTAT) 0.4 MG SL TABLET    Place 1 tablet (0.4 mg total) under the tongue every 5 (five) minutes as needed for chest pain.   ONDANSETRON (ZOFRAN) 4 MG TABLET    Take 4 mg by mouth every 8 (eight) hours as needed for nausea or vomiting.  Modified Medications   No medications on file  Discontinued Medications   No medications on file    Review of Systems  Constitutional: Negative for chills, activity change and fatigue.  HENT: Negative for sore throat and trouble swallowing.   Eyes: Negative for visual disturbance.  Respiratory: Negative for cough, chest tightness and shortness of breath.   Cardiovascular: Negative for chest pain, palpitations and leg swelling.   Gastrointestinal: Positive for nausea and vomiting. Negative for abdominal pain and blood in stool.  Genitourinary: Negative for urgency, frequency and difficulty urinating.  Musculoskeletal: Positive for joint swelling (right knee) and gait problem. Negative for arthralgias.  Skin: Negative for rash.  Neurological: Positive for weakness. Negative for seizures and headaches.  Psychiatric/Behavioral: Negative for confusion and sleep disturbance. The  patient is not nervous/anxious.     Filed Vitals:   06/06/15 1128  BP: 180/100  Pulse: 90  Temp: 98.2 F (36.8 C)  TempSrc: Oral  Resp: 19  Height: 5' 11"  (1.803 m)  Weight: 244 lb (110.678 kg)  SpO2: 94%   Body mass index is 34.05 kg/(m^2).  Physical Exam  Constitutional: He is oriented to person, place, and time. He appears well-developed and well-nourished.  HENT:  Mouth/Throat: Oropharynx is clear and moist.  Eyes: Pupils are equal, round, and reactive to light. No scleral icterus.  Neck: Neck supple. Carotid bruit is not present.  Cardiovascular: Normal rate, regular rhythm, normal heart sounds and intact distal pulses.  Exam reveals no gallop and no friction rub.   No murmur heard. no distal LE swelling. No calf TTP  Pulmonary/Chest: Effort normal and breath sounds normal. He has no wheezes. He has no rales. He exhibits no tenderness.  Abdominal: Soft. Bowel sounds are normal. He exhibits no distension, no abdominal bruit, no pulsatile midline mass and no mass. There is no tenderness. There is no rebound and no guarding.  Musculoskeletal: He exhibits edema (right knee) and tenderness.  Lymphadenopathy:    He has no cervical adenopathy.  Neurological: He is alert and oriented to person, place, and time.  Left hemiparesis  Skin: Skin is warm and dry. No rash noted.  Psychiatric: He has a normal mood and affect. His behavior is normal. Judgment and thought content normal.     Labs reviewed: Admission on 06/01/2015,  Discharged on 06/02/2015  Component Date Value Ref Range Status  . Sodium 06/01/2015 145  135 - 145 mmol/L Final  . Potassium 06/01/2015 3.8  3.5 - 5.1 mmol/L Final  . Chloride 06/01/2015 109  101 - 111 mmol/L Final  . CO2 06/01/2015 25  22 - 32 mmol/L Final  . Glucose, Bld 06/01/2015 137* 65 - 99 mg/dL Final  . BUN 06/01/2015 6  6 - 20 mg/dL Final  . Creatinine, Ser 06/01/2015 1.48* 0.61 - 1.24 mg/dL Final  . Calcium 06/01/2015 9.4  8.9 - 10.3 mg/dL Final  . GFR calc non Af Amer 06/01/2015 52* >60 mL/min Final  . GFR calc Af Amer 06/01/2015 >60  >60 mL/min Final   Comment: (NOTE) The eGFR has been calculated using the CKD EPI equation. This calculation has not been validated in all clinical situations. eGFR's persistently <60 mL/min signify possible Chronic Kidney Disease.   . Anion gap 06/01/2015 11  5 - 15 Final  . WBC 06/01/2015 9.1  4.0 - 10.5 K/uL Final  . RBC 06/01/2015 4.32  4.22 - 5.81 MIL/uL Final  . Hemoglobin 06/01/2015 12.6* 13.0 - 17.0 g/dL Final  . HCT 06/01/2015 38.1* 39.0 - 52.0 % Final  . MCV 06/01/2015 88.2  78.0 - 100.0 fL Final  . MCH 06/01/2015 29.2  26.0 - 34.0 pg Final  . MCHC 06/01/2015 33.1  30.0 - 36.0 g/dL Final  . RDW 06/01/2015 13.4  11.5 - 15.5 % Final  . Platelets 06/01/2015 499* 150 - 400 K/uL Final  . Troponin i, poc 06/01/2015 0.01  0.00 - 0.08 ng/mL Final  . Comment 3 06/01/2015          Final   Comment: Due to the release kinetics of cTnI, a negative result within the first hours of the onset of symptoms does not rule out myocardial infarction with certainty. If myocardial infarction is still suspected, repeat the test at appropriate intervals.   Edmonia Lynch 06/01/2015  123* 65 - 99 mg/dL Final  . Color, Urine 06/01/2015 YELLOW  YELLOW Final  . APPearance 06/01/2015 CLEAR  CLEAR Final  . Specific Gravity, Urine 06/01/2015 1.015  1.005 - 1.030 Final  . pH 06/01/2015 6.5  5.0 - 8.0 Final  . Glucose, UA 06/01/2015 NEGATIVE  NEGATIVE  mg/dL Final  . Hgb urine dipstick 06/01/2015 TRACE* NEGATIVE Final  . Bilirubin Urine 06/01/2015 NEGATIVE  NEGATIVE Final  . Ketones, ur 06/01/2015 NEGATIVE  NEGATIVE mg/dL Final  . Protein, ur 06/01/2015 >300* NEGATIVE mg/dL Final  . Nitrite 06/01/2015 NEGATIVE  NEGATIVE Final  . Leukocytes, UA 06/01/2015 NEGATIVE  NEGATIVE Final  . Total Protein 06/01/2015 7.2  6.5 - 8.1 g/dL Final  . Albumin 06/01/2015 3.5  3.5 - 5.0 g/dL Final  . AST 06/01/2015 20  15 - 41 U/L Final  . ALT 06/01/2015 21  17 - 63 U/L Final  . Alkaline Phosphatase 06/01/2015 108  38 - 126 U/L Final  . Total Bilirubin 06/01/2015 0.4  0.3 - 1.2 mg/dL Final  . Bilirubin, Direct 06/01/2015 <0.1* 0.1 - 0.5 mg/dL Final  . Indirect Bilirubin 06/01/2015 NOT CALCULATED  0.3 - 0.9 mg/dL Final  . Troponin i, poc 06/01/2015 0.00  0.00 - 0.08 ng/mL Final  . Comment 3 06/01/2015          Final   Comment: Due to the release kinetics of cTnI, a negative result within the first hours of the onset of symptoms does not rule out myocardial infarction with certainty. If myocardial infarction is still suspected, repeat the test at appropriate intervals.   . Opiates 06/01/2015 NONE DETECTED  NONE DETECTED Final  . Cocaine 06/01/2015 NONE DETECTED  NONE DETECTED Final  . Benzodiazepines 06/01/2015 NONE DETECTED  NONE DETECTED Final  . Amphetamines 06/01/2015 NONE DETECTED  NONE DETECTED Final  . Tetrahydrocannabinol 06/01/2015 NONE DETECTED  NONE DETECTED Final  . Barbiturates 06/01/2015 NONE DETECTED  NONE DETECTED Final   Comment:        DRUG SCREEN FOR MEDICAL PURPOSES ONLY.  IF CONFIRMATION IS NEEDED FOR ANY PURPOSE, NOTIFY LAB WITHIN 5 DAYS.        LOWEST DETECTABLE LIMITS FOR URINE DRUG SCREEN Drug Class       Cutoff (ng/mL) Amphetamine      1000 Barbiturate      200 Benzodiazepine   465 Tricyclics       035 Opiates          300 Cocaine          300 THC              50   . Squamous Epithelial / LPF 06/01/2015 0-5* NONE  SEEN Final  . WBC, UA 06/01/2015 0-5  0 - 5 WBC/hpf Final  . RBC / HPF 06/01/2015 0-5  0 - 5 RBC/hpf Final  . Bacteria, UA 06/01/2015 FEW* NONE SEEN Final  . Casts 06/01/2015 HYALINE CASTS* NEGATIVE Final   GRANULAR CAST  . Troponin I 06/01/2015 <0.03  <0.031 ng/mL Final   Comment:        NO INDICATION OF MYOCARDIAL INJURY.   . Troponin I 06/01/2015 <0.03  <0.031 ng/mL Final   Comment:        NO INDICATION OF MYOCARDIAL INJURY.   . Troponin I 06/01/2015 <0.03  <0.031 ng/mL Final   Comment:        NO INDICATION OF MYOCARDIAL INJURY.   . Glucose-Capillary 06/01/2015 87  65 - 99 mg/dL Final  .  Glucose-Capillary 06/01/2015 135* 65 - 99 mg/dL Final  . Glucose-Capillary 06/02/2015 93  65 - 99 mg/dL Final  . Glucose-Capillary 06/02/2015 87  65 - 99 mg/dL Final  . Glucose-Capillary 06/02/2015 95  65 - 99 mg/dL Final  Admission on 05/21/2015, Discharged on 05/21/2015  Component Date Value Ref Range Status  . Glucose-Capillary 05/21/2015 96  65 - 99 mg/dL Final  . Glucose-Capillary 05/21/2015 106* 65 - 99 mg/dL Final  . Sodium 05/21/2015 142  135 - 145 mmol/L Final  . Potassium 05/21/2015 4.0  3.5 - 5.1 mmol/L Final  . Chloride 05/21/2015 109  101 - 111 mmol/L Final  . CO2 05/21/2015 22  22 - 32 mmol/L Final  . Glucose, Bld 05/21/2015 104* 65 - 99 mg/dL Final  . BUN 05/21/2015 14  6 - 20 mg/dL Final  . Creatinine, Ser 05/21/2015 1.71* 0.61 - 1.24 mg/dL Final  . Calcium 05/21/2015 9.2  8.9 - 10.3 mg/dL Final  . GFR calc non Af Amer 05/21/2015 44* >60 mL/min Final  . GFR calc Af Amer 05/21/2015 51* >60 mL/min Final   Comment: (NOTE) The eGFR has been calculated using the CKD EPI equation. This calculation has not been validated in all clinical situations. eGFR's persistently <60 mL/min signify possible Chronic Kidney Disease.   . Anion gap 05/21/2015 11  5 - 15 Final  . WBC 05/21/2015 9.6  4.0 - 10.5 K/uL Final  . RBC 05/21/2015 4.03* 4.22 - 5.81 MIL/uL Final  . Hemoglobin  05/21/2015 11.2* 13.0 - 17.0 g/dL Final  . HCT 05/21/2015 35.7* 39.0 - 52.0 % Final  . MCV 05/21/2015 88.6  78.0 - 100.0 fL Final  . MCH 05/21/2015 27.8  26.0 - 34.0 pg Final  . MCHC 05/21/2015 31.4  30.0 - 36.0 g/dL Final  . RDW 05/21/2015 13.6  11.5 - 15.5 % Final  . Platelets 05/21/2015 386  150 - 400 K/uL Final  . Neutrophils Relative % 05/21/2015 49   Final  . Neutro Abs 05/21/2015 4.8  1.7 - 7.7 K/uL Final  . Lymphocytes Relative 05/21/2015 32   Final  . Lymphs Abs 05/21/2015 3.0  0.7 - 4.0 K/uL Final  . Monocytes Relative 05/21/2015 9   Final  . Monocytes Absolute 05/21/2015 0.8  0.1 - 1.0 K/uL Final  . Eosinophils Relative 05/21/2015 9   Final  . Eosinophils Absolute 05/21/2015 0.8* 0.0 - 0.7 K/uL Final  . Basophils Relative 05/21/2015 1   Final  . Basophils Absolute 05/21/2015 0.1  0.0 - 0.1 K/uL Final  Admission on 05/16/2015, Discharged on 05/19/2015  Component Date Value Ref Range Status  . WBC 05/16/2015 9.6  4.0 - 10.5 K/uL Final  . RBC 05/16/2015 4.27  4.22 - 5.81 MIL/uL Final  . Hemoglobin 05/16/2015 12.2* 13.0 - 17.0 g/dL Final  . HCT 05/16/2015 37.9* 39.0 - 52.0 % Final  . MCV 05/16/2015 88.8  78.0 - 100.0 fL Final  . MCH 05/16/2015 28.6  26.0 - 34.0 pg Final  . MCHC 05/16/2015 32.2  30.0 - 36.0 g/dL Final  . RDW 05/16/2015 13.6  11.5 - 15.5 % Final  . Platelets 05/16/2015 357  150 - 400 K/uL Final  . Neutrophils Relative % 05/16/2015 52   Final  . Neutro Abs 05/16/2015 5.1  1.7 - 7.7 K/uL Final  . Lymphocytes Relative 05/16/2015 31   Final  . Lymphs Abs 05/16/2015 3.0  0.7 - 4.0 K/uL Final  . Monocytes Relative 05/16/2015 8   Final  . Monocytes  Absolute 05/16/2015 0.7  0.1 - 1.0 K/uL Final  . Eosinophils Relative 05/16/2015 8   Final  . Eosinophils Absolute 05/16/2015 0.8* 0.0 - 0.7 K/uL Final  . Basophils Relative 05/16/2015 1   Final  . Basophils Absolute 05/16/2015 0.1  0.0 - 0.1 K/uL Final  . Sodium 05/16/2015 143  135 - 145 mmol/L Final  . Potassium  05/16/2015 3.8  3.5 - 5.1 mmol/L Final  . Chloride 05/16/2015 109  101 - 111 mmol/L Final  . CO2 05/16/2015 21* 22 - 32 mmol/L Final  . Glucose, Bld 05/16/2015 135* 65 - 99 mg/dL Final  . BUN 05/16/2015 17  6 - 20 mg/dL Final  . Creatinine, Ser 05/16/2015 2.02* 0.61 - 1.24 mg/dL Final  . Calcium 05/16/2015 9.3  8.9 - 10.3 mg/dL Final  . Total Protein 05/16/2015 6.9  6.5 - 8.1 g/dL Final  . Albumin 05/16/2015 3.3* 3.5 - 5.0 g/dL Final  . AST 05/16/2015 33  15 - 41 U/L Final  . ALT 05/16/2015 27  17 - 63 U/L Final  . Alkaline Phosphatase 05/16/2015 111  38 - 126 U/L Final  . Total Bilirubin 05/16/2015 0.5  0.3 - 1.2 mg/dL Final  . GFR calc non Af Amer 05/16/2015 36* >60 mL/min Final  . GFR calc Af Amer 05/16/2015 41* >60 mL/min Final   Comment: (NOTE) The eGFR has been calculated using the CKD EPI equation. This calculation has not been validated in all clinical situations. eGFR's persistently <60 mL/min signify possible Chronic Kidney Disease.   . Anion gap 05/16/2015 13  5 - 15 Final  . Color, Urine 05/17/2015 YELLOW  YELLOW Final  . APPearance 05/17/2015 CLOUDY* CLEAR Final  . Specific Gravity, Urine 05/17/2015 1.015  1.005 - 1.030 Final  . pH 05/17/2015 5.0  5.0 - 8.0 Final  . Glucose, UA 05/17/2015 NEGATIVE  NEGATIVE mg/dL Final  . Hgb urine dipstick 05/17/2015 TRACE* NEGATIVE Final  . Bilirubin Urine 05/17/2015 NEGATIVE  NEGATIVE Final  . Ketones, ur 05/17/2015 NEGATIVE  NEGATIVE mg/dL Final  . Protein, ur 05/17/2015 >300* NEGATIVE mg/dL Final  . Nitrite 05/17/2015 NEGATIVE  NEGATIVE Final  . Leukocytes, UA 05/17/2015 NEGATIVE  NEGATIVE Final  . Squamous Epithelial / LPF 05/17/2015 0-5* NONE SEEN Final  . WBC, UA 05/17/2015 0-5  0 - 5 WBC/hpf Final  . RBC / HPF 05/17/2015 0-5  0 - 5 RBC/hpf Final  . Bacteria, UA 05/17/2015 RARE* NONE SEEN Final  . Casts 05/17/2015 HYALINE CASTS* NEGATIVE Final  . Urine-Other 05/17/2015 MUCOUS PRESENT   Final  . Lamotrigine Lvl 05/17/2015  10.0  2.0 - 20.0 ug/mL Final   Comment: (NOTE)                                Detection Limit = 1.0 Performed At: [facility] [place], [place] [id number] Lindon Romp MD [id number]   . Sodium 05/17/2015 142  135 - 145 mmol/L Final  . Potassium 05/17/2015 3.7  3.5 - 5.1 mmol/L Final  . Chloride 05/17/2015 109  101 - 111 mmol/L Final  . CO2 05/17/2015 23  22 - 32 mmol/L Final  . Glucose, Bld 05/17/2015 128* 65 - 99 mg/dL Final  . BUN 05/17/2015 15  6 - 20 mg/dL Final  . Creatinine, Ser 05/17/2015 1.89* 0.61 - 1.24 mg/dL Final  . Calcium 05/17/2015 8.8* 8.9 - 10.3 mg/dL Final  . GFR calc non Af  Amer 05/17/2015 39* >60 mL/min Final  . GFR calc Af Amer 05/17/2015 45* >60 mL/min Final   Comment: (NOTE) The eGFR has been calculated using the CKD EPI equation. This calculation has not been validated in all clinical situations. eGFR's persistently <60 mL/min signify possible Chronic Kidney Disease.   . Anion gap 05/17/2015 10  5 - 15 Final  . WBC 05/17/2015 9.4  4.0 - 10.5 K/uL Final  . RBC 05/17/2015 4.04* 4.22 - 5.81 MIL/uL Final  . Hemoglobin 05/17/2015 11.7* 13.0 - 17.0 g/dL Final  . HCT 05/17/2015 36.3* 39.0 - 52.0 % Final  . MCV 05/17/2015 89.9  78.0 - 100.0 fL Final  . MCH 05/17/2015 29.0  26.0 - 34.0 pg Final  . MCHC 05/17/2015 32.2  30.0 - 36.0 g/dL Final  . RDW 05/17/2015 13.8  11.5 - 15.5 % Final  . Platelets 05/17/2015 341  150 - 400 K/uL Final  . Microalb, Ur 05/17/2015 2341.7* Not Estab. ug/mL Final   Comment: (NOTE) Results confirmed on dilution.   . Microalb Creat Ratio 05/17/2015 2011.8* 0.0 - 30.0 mg/g creat Final   Comment: (NOTE) Performed At: [facility] [place], [place] [id number] Lindon Romp MD [id number]   . Creatinine, Urine 05/17/2015 116.4  Not Estab. mg/dL Final  . MRSA by PCR 05/17/2015 POSITIVE* NEGATIVE Final   Comment:        The GeneXpert MRSA Assay (FDA approved for NASAL  specimens only), is one component of a comprehensive MRSA colonization surveillance program. It is not intended to diagnose MRSA infection nor to guide or monitor treatment for MRSA infections. RESULT CALLED TO, READ BACK BY AND VERIFIED WITH: Rollene Rotunda RN 12:10 05/17/15 ([email or web address])   . Glucose-Capillary 05/17/2015 127* 65 - 99 mg/dL Final  . Glucose-Capillary 05/17/2015 133* 65 - 99 mg/dL Final  . WBC 05/18/2015 8.4  4.0 - 10.5 K/uL Final  . RBC 05/18/2015 4.11* 4.22 - 5.81 MIL/uL Final  . Hemoglobin 05/18/2015 11.5* 13.0 - 17.0 g/dL Final  . HCT 05/18/2015 36.0* 39.0 - 52.0 % Final  . MCV 05/18/2015 87.6  78.0 - 100.0 fL Final  . MCH 05/18/2015 28.0  26.0 - 34.0 pg Final  . MCHC 05/18/2015 31.9  30.0 - 36.0 g/dL Final  . RDW 05/18/2015 13.6  11.5 - 15.5 % Final  . Platelets 05/18/2015 351  150 - 400 K/uL Final  . Sodium 05/18/2015 142  135 - 145 mmol/L Final  . Potassium 05/18/2015 3.7  3.5 - 5.1 mmol/L Final  . Chloride 05/18/2015 111  101 - 111 mmol/L Final  . CO2 05/18/2015 24  22 - 32 mmol/L Final  . Glucose, Bld 05/18/2015 120* 65 - 99 mg/dL Final  . BUN 05/18/2015 12  6 - 20 mg/dL Final  . Creatinine, Ser 05/18/2015 1.47* 0.61 - 1.24 mg/dL Final  . Calcium 05/18/2015 8.9  8.9 - 10.3 mg/dL Final  . GFR calc non Af Amer 05/18/2015 52* >60 mL/min Final  . GFR calc Af Amer 05/18/2015 >60  >60 mL/min Final   Comment: (NOTE) The eGFR has been calculated using the CKD EPI equation. This calculation has not been validated in all clinical situations. eGFR's persistently <60 mL/min signify possible Chronic Kidney Disease.   . Anion gap 05/18/2015 7  5 - 15 Final  . Glucose-Capillary 05/17/2015 102* 65 - 99 mg/dL Final  . Glucose-Capillary 05/18/2015 115* 65 - 99 mg/dL Final  . Glucose-Capillary 05/18/2015 109* 65 - 99 mg/dL Final  .  Opiates 05/18/2015 NONE DETECTED  NONE DETECTED Final  . Cocaine 05/18/2015 NONE DETECTED  NONE DETECTED Final  . Benzodiazepines 05/18/2015 NONE  DETECTED  NONE DETECTED Final  . Amphetamines 05/18/2015 NONE DETECTED  NONE DETECTED Final  . Tetrahydrocannabinol 05/18/2015 NONE DETECTED  NONE DETECTED Final  . Barbiturates 05/18/2015 NONE DETECTED  NONE DETECTED Final   Comment:        DRUG SCREEN FOR MEDICAL PURPOSES ONLY.  IF CONFIRMATION IS NEEDED FOR ANY PURPOSE, NOTIFY LAB WITHIN 5 DAYS.        LOWEST DETECTABLE LIMITS FOR URINE DRUG SCREEN Drug Class       Cutoff (ng/mL) Amphetamine      1000 Barbiturate      200 Benzodiazepine   774 Tricyclics       128 Opiates          300 Cocaine          300 THC              50   . Glucose-Capillary 05/18/2015 138* 65 - 99 mg/dL Final  . Sodium 05/19/2015 142  135 - 145 mmol/L Final  . Potassium 05/19/2015 3.6  3.5 - 5.1 mmol/L Final  . Chloride 05/19/2015 109  101 - 111 mmol/L Final  . CO2 05/19/2015 24  22 - 32 mmol/L Final  . Glucose, Bld 05/19/2015 102* 65 - 99 mg/dL Final  . BUN 05/19/2015 13  6 - 20 mg/dL Final  . Creatinine, Ser 05/19/2015 1.53* 0.61 - 1.24 mg/dL Final  . Calcium 05/19/2015 9.1  8.9 - 10.3 mg/dL Final  . GFR calc non Af Amer 05/19/2015 50* >60 mL/min Final  . GFR calc Af Amer 05/19/2015 58* >60 mL/min Final   Comment: (NOTE) The eGFR has been calculated using the CKD EPI equation. This calculation has not been validated in all clinical situations. eGFR's persistently <60 mL/min signify possible Chronic Kidney Disease.   . Anion gap 05/19/2015 9  5 - 15 Final  . Glucose-Capillary 05/18/2015 108* 65 - 99 mg/dL Final  . Glucose-Capillary 05/18/2015 148* 65 - 99 mg/dL Final  . Comment 1 05/18/2015 Notify RN   Final  . Comment 2 05/18/2015 Document in Chart   Final  . Glucose-Capillary 05/19/2015 83  65 - 99 mg/dL Final  . Glucose-Capillary 05/19/2015 107* 65 - 99 mg/dL Final  . Glucose-Capillary 05/19/2015 132* 65 - 99 mg/dL Final  Admission on 05/11/2015, Discharged on 05/16/2015  Component Date Value Ref Range Status  . Sodium 05/11/2015 141  135 -  145 mmol/L Final  . Potassium 05/11/2015 4.0  3.5 - 5.1 mmol/L Final  . Chloride 05/11/2015 106  101 - 111 mmol/L Final  . BUN 05/11/2015 22* 6 - 20 mg/dL Final  . Creatinine, Ser 05/11/2015 1.90* 0.61 - 1.24 mg/dL Final  . Glucose, Bld 05/11/2015 302* 65 - 99 mg/dL Final  . Calcium, Ion 05/11/2015 1.12  1.12 - 1.23 mmol/L Final  . TCO2 05/11/2015 22  0 - 100 mmol/L Final  . Hemoglobin 05/11/2015 12.9* 13.0 - 17.0 g/dL Final  . HCT 05/11/2015 38.0* 39.0 - 52.0 % Final  . Alcohol, Ethyl (B) 05/11/2015 <5  <5 mg/dL Final   Comment:        LOWEST DETECTABLE LIMIT FOR SERUM ALCOHOL IS 5 mg/dL FOR MEDICAL PURPOSES ONLY   . Prothrombin Time 05/11/2015 12.7  11.6 - 15.2 seconds Final  . INR 05/11/2015 0.93  0.00 - 1.49 Final  . aPTT 05/11/2015 29  24 - 37 seconds Final  . WBC 05/11/2015 7.5  4.0 - 10.5 K/uL Final  . RBC 05/11/2015 4.16* 4.22 - 5.81 MIL/uL Final  . Hemoglobin 05/11/2015 12.1* 13.0 - 17.0 g/dL Final  . HCT 05/11/2015 36.6* 39.0 - 52.0 % Final  . MCV 05/11/2015 88.0  78.0 - 100.0 fL Final  . MCH 05/11/2015 29.1  26.0 - 34.0 pg Final  . MCHC 05/11/2015 33.1  30.0 - 36.0 g/dL Final  . RDW 05/11/2015 13.5  11.5 - 15.5 % Final  . Platelets 05/11/2015 325  150 - 400 K/uL Final  . Neutrophils Relative % 05/11/2015 54   Final  . Neutro Abs 05/11/2015 4.0  1.7 - 7.7 K/uL Final  . Lymphocytes Relative 05/11/2015 32   Final  . Lymphs Abs 05/11/2015 2.4  0.7 - 4.0 K/uL Final  . Monocytes Relative 05/11/2015 7   Final  . Monocytes Absolute 05/11/2015 0.5  0.1 - 1.0 K/uL Final  . Eosinophils Relative 05/11/2015 6   Final  . Eosinophils Absolute 05/11/2015 0.4  0.0 - 0.7 K/uL Final  . Basophils Relative 05/11/2015 1   Final  . Basophils Absolute 05/11/2015 0.1  0.0 - 0.1 K/uL Final  . Sodium 05/11/2015 140  135 - 145 mmol/L Final  . Potassium 05/11/2015 4.2  3.5 - 5.1 mmol/L Final  . Chloride 05/11/2015 109  101 - 111 mmol/L Final  . CO2 05/11/2015 21* 22 - 32 mmol/L Final  .  Glucose, Bld 05/11/2015 304* 65 - 99 mg/dL Final  . BUN 05/11/2015 20  6 - 20 mg/dL Final  . Creatinine, Ser 05/11/2015 2.11* 0.61 - 1.24 mg/dL Final  . Calcium 05/11/2015 8.7* 8.9 - 10.3 mg/dL Final  . Total Protein 05/11/2015 6.1* 6.5 - 8.1 g/dL Final  . Albumin 05/11/2015 2.9* 3.5 - 5.0 g/dL Final  . AST 05/11/2015 21  15 - 41 U/L Final  . ALT 05/11/2015 13* 17 - 63 U/L Final  . Alkaline Phosphatase 05/11/2015 90  38 - 126 U/L Final  . Total Bilirubin 05/11/2015 0.2* 0.3 - 1.2 mg/dL Final  . GFR calc non Af Amer 05/11/2015 34* >60 mL/min Final  . GFR calc Af Amer 05/11/2015 39* >60 mL/min Final   Comment: (NOTE) The eGFR has been calculated using the CKD EPI equation. This calculation has not been validated in all clinical situations. eGFR's persistently <60 mL/min signify possible Chronic Kidney Disease.   . Anion gap 05/11/2015 10  5 - 15 Final  . Troponin i, poc 05/11/2015 0.00  0.00 - 0.08 ng/mL Final  . Comment 3 05/11/2015          Final   Comment: Due to the release kinetics of cTnI, a negative result within the first hours of the onset of symptoms does not rule out myocardial infarction with certainty. If myocardial infarction is still suspected, repeat the test at appropriate intervals.   . Opiates 05/11/2015 NONE DETECTED  NONE DETECTED Final  . Cocaine 05/11/2015 NONE DETECTED  NONE DETECTED Final  . Benzodiazepines 05/11/2015 NONE DETECTED  NONE DETECTED Final  . Amphetamines 05/11/2015 NONE DETECTED  NONE DETECTED Final  . Tetrahydrocannabinol 05/11/2015 NONE DETECTED  NONE DETECTED Final  . Barbiturates 05/11/2015 NONE DETECTED  NONE DETECTED Final   Comment:        DRUG SCREEN FOR MEDICAL PURPOSES ONLY.  IF CONFIRMATION IS NEEDED FOR ANY PURPOSE, NOTIFY LAB WITHIN 5 DAYS.        LOWEST DETECTABLE LIMITS FOR URINE DRUG  SCREEN Drug Class       Cutoff (ng/mL) Amphetamine      1000 Barbiturate      200 Benzodiazepine   027 Tricyclics       253 Opiates           300 Cocaine          300 THC              50   . Color, Urine 05/11/2015 YELLOW  YELLOW Final  . APPearance 05/11/2015 CLEAR  CLEAR Final  . Specific Gravity, Urine 05/11/2015 1.017  1.005 - 1.030 Final  . pH 05/11/2015 6.5  5.0 - 8.0 Final  . Glucose, UA 05/11/2015 >1000* NEGATIVE mg/dL Final  . Hgb urine dipstick 05/11/2015 NEGATIVE  NEGATIVE Final  . Bilirubin Urine 05/11/2015 NEGATIVE  NEGATIVE Final  . Ketones, ur 05/11/2015 NEGATIVE  NEGATIVE mg/dL Final  . Protein, ur 05/11/2015 100* NEGATIVE mg/dL Final  . Nitrite 05/11/2015 NEGATIVE  NEGATIVE Final  . Leukocytes, UA 05/11/2015 NEGATIVE  NEGATIVE Final  . Squamous Epithelial / LPF 05/11/2015 0-5* NONE SEEN Final  . WBC, UA 05/11/2015 0-5  0 - 5 WBC/hpf Final  . RBC / HPF 05/11/2015 0-5  0 - 5 RBC/hpf Final  . Bacteria, UA 05/11/2015 RARE* NONE SEEN Final  . Lamotrigine Lvl 05/11/2015 None Detected  2.0 - 20.0 ug/mL Final   Comment: (NOTE)                                Detection Limit = 1.0 Performed At: [facility] [place], [place] [id number] Lindon Romp MD [id number]   . Hgb A1c MFr Bld 05/12/2015 10.5* 4.8 - 5.6 % Final   Comment: (NOTE)         Pre-diabetes: 5.7 - 6.4         Diabetes: >6.4         Glycemic control for adults with diabetes: <7.0   . Mean Plasma Glucose 05/12/2015 255   Final   Comment: (NOTE) Performed At: [facility] [place], [place] [id number] Lindon Romp MD [id number]:[id number]   . Glucose-Capillary 05/11/2015 156* 65 - 99 mg/dL Final  . WBC 05/12/2015 8.2  4.0 - 10.5 K/uL Final  . RBC 05/12/2015 3.80* 4.22 - 5.81 MIL/uL Final  . Hemoglobin 05/12/2015 10.5* 13.0 - 17.0 g/dL Final  . HCT 05/12/2015 33.3* 39.0 - 52.0 % Final  . MCV 05/12/2015 87.6  78.0 - 100.0 fL Final  . MCH 05/12/2015 27.6  26.0 - 34.0 pg Final  . MCHC 05/12/2015 31.5  30.0 - 36.0 g/dL Final  . RDW 05/12/2015 13.6  11.5 - 15.5 % Final  . Platelets 05/12/2015 316   150 - 400 K/uL Final  . Sodium 05/12/2015 142  135 - 145 mmol/L Final  . Potassium 05/12/2015 3.8  3.5 - 5.1 mmol/L Final  . Chloride 05/12/2015 112* 101 - 111 mmol/L Final  . CO2 05/12/2015 22  22 - 32 mmol/L Final  . Glucose, Bld 05/12/2015 165* 65 - 99 mg/dL Final  . BUN 05/12/2015 14  6 - 20 mg/dL Final  . Creatinine, Ser 05/12/2015 1.45* 0.61 - 1.24 mg/dL Final  . Calcium 05/12/2015 8.0* 8.9 - 10.3 mg/dL Final  . GFR calc non Af Amer 05/12/2015 53* >60 mL/min Final  . GFR calc Af Amer 05/12/2015 >60  >60 mL/min Final   Comment: (NOTE)  The eGFR has been calculated using the CKD EPI equation. This calculation has not been validated in all clinical situations. eGFR's persistently <60 mL/min signify possible Chronic Kidney Disease.   . Anion gap 05/12/2015 8  5 - 15 Final  . Glucose-Capillary 05/11/2015 123* 65 - 99 mg/dL Final  . Comment 1 05/11/2015 Notify RN   Final  . Comment 2 05/11/2015 Document in Chart   Final  . Cholesterol 05/12/2015 204* 0 - 200 mg/dL Final  . Triglycerides 05/12/2015 139  <150 mg/dL Final  . HDL 05/12/2015 35* >40 mg/dL Final  . Total CHOL/HDL Ratio 05/12/2015 5.8   Final  . VLDL 05/12/2015 28  0 - 40 mg/dL Final  . LDL Cholesterol 05/12/2015 141* 0 - 99 mg/dL Final   Comment:        Total Cholesterol/HDL:CHD Risk Coronary Heart Disease Risk Table                     Men   Women  1/2 Average Risk   3.4   3.3  Average Risk       5.0   4.4  2 X Average Risk   9.6   7.1  3 X Average Risk  23.4   11.0        Use the calculated Patient Ratio above and the CHD Risk Table to determine the patient's CHD Risk.        ATP III CLASSIFICATION (LDL):  <100     mg/dL   Optimal  100-129  mg/dL   Near or Above                    Optimal  130-159  mg/dL   Borderline  160-189  mg/dL   High  >190     mg/dL   Very High   . Troponin I 05/12/2015 <0.03  <0.031 ng/mL Final   Comment:        NO INDICATION OF MYOCARDIAL INJURY.   . Glucose-Capillary  05/12/2015 141* 65 - 99 mg/dL Final  . Comment 1 05/12/2015 Notify RN   Final  . Comment 2 05/12/2015 Document in Chart   Final  . Glucose-Capillary 05/12/2015 171* 65 - 99 mg/dL Final  . Glucose-Capillary 05/12/2015 150* 65 - 99 mg/dL Final  . Comment 1 05/12/2015 Notify RN   Final  . Comment 2 05/12/2015 Document in Chart   Final  . Glucose-Capillary 05/12/2015 150* 65 - 99 mg/dL Final  . Comment 1 05/12/2015 Notify RN   Final  . Comment 2 05/12/2015 Document in Chart   Final  . Glucose-Capillary 05/13/2015 122* 65 - 99 mg/dL Final  . Comment 1 05/13/2015 Notify RN   Final  . Comment 2 05/13/2015 Document in Chart   Final  . Sodium 05/13/2015 142  135 - 145 mmol/L Final  . Potassium 05/13/2015 4.2  3.5 - 5.1 mmol/L Final  . Chloride 05/13/2015 113* 101 - 111 mmol/L Final  . CO2 05/13/2015 23  22 - 32 mmol/L Final  . Glucose, Bld 05/13/2015 116* 65 - 99 mg/dL Final  . BUN 05/13/2015 13  6 - 20 mg/dL Final  . Creatinine, Ser 05/13/2015 1.49* 0.61 - 1.24 mg/dL Final  . Calcium 05/13/2015 8.7* 8.9 - 10.3 mg/dL Final  . GFR calc non Af Amer 05/13/2015 52* >60 mL/min Final  . GFR calc Af Amer 05/13/2015 60* >60 mL/min Final   Comment: (NOTE) The eGFR has been calculated using the CKD EPI  equation. This calculation has not been validated in all clinical situations. eGFR's persistently <60 mL/min signify possible Chronic Kidney Disease.   . Anion gap 05/13/2015 6  5 - 15 Final  . WBC 05/13/2015 7.8  4.0 - 10.5 K/uL Final  . RBC 05/13/2015 4.14* 4.22 - 5.81 MIL/uL Final  . Hemoglobin 05/13/2015 11.6* 13.0 - 17.0 g/dL Final  . HCT 05/13/2015 36.7* 39.0 - 52.0 % Final  . MCV 05/13/2015 88.6  78.0 - 100.0 fL Final  . MCH 05/13/2015 28.0  26.0 - 34.0 pg Final  . MCHC 05/13/2015 31.6  30.0 - 36.0 g/dL Final  . RDW 05/13/2015 13.5  11.5 - 15.5 % Final  . Platelets 05/13/2015 325  150 - 400 K/uL Final  . Glucose-Capillary 05/13/2015 108* 65 - 99 mg/dL Final  . Glucose-Capillary 05/13/2015  163* 65 - 99 mg/dL Final  . Glucose-Capillary 05/13/2015 135* 65 - 99 mg/dL Final  . Comment 1 05/13/2015 Notify RN   Final  . Comment 2 05/13/2015 Document in Chart   Final  . Sodium 05/14/2015 141  135 - 145 mmol/L Final  . Potassium 05/14/2015 3.6  3.5 - 5.1 mmol/L Final  . Chloride 05/14/2015 109  101 - 111 mmol/L Final  . CO2 05/14/2015 22  22 - 32 mmol/L Final  . Glucose, Bld 05/14/2015 119* 65 - 99 mg/dL Final  . BUN 05/14/2015 11  6 - 20 mg/dL Final  . Creatinine, Ser 05/14/2015 1.49* 0.61 - 1.24 mg/dL Final  . Calcium 05/14/2015 8.9  8.9 - 10.3 mg/dL Final  . GFR calc non Af Amer 05/14/2015 52* >60 mL/min Final  . GFR calc Af Amer 05/14/2015 60* >60 mL/min Final   Comment: (NOTE) The eGFR has been calculated using the CKD EPI equation. This calculation has not been validated in all clinical situations. eGFR's persistently <60 mL/min signify possible Chronic Kidney Disease.   . Anion gap 05/14/2015 10  5 - 15 Final  . Glucose-Capillary 05/14/2015 115* 65 - 99 mg/dL Final  . Comment 1 05/14/2015 Notify RN   Final  . Comment 2 05/14/2015 Document in Chart   Final  . Glucose-Capillary 05/14/2015 141* 65 - 99 mg/dL Final  . Glucose-Capillary 05/14/2015 136* 65 - 99 mg/dL Final  . Glucose-Capillary 05/14/2015 161* 65 - 99 mg/dL Final  . Glucose-Capillary 05/14/2015 120* 65 - 99 mg/dL Final  . Glucose-Capillary 05/15/2015 126* 65 - 99 mg/dL Final  . Glucose-Capillary 05/15/2015 162* 65 - 99 mg/dL Final  . Glucose-Capillary 05/15/2015 163* 65 - 99 mg/dL Final  . Glucose-Capillary 05/15/2015 116* 65 - 99 mg/dL Final  . Comment 1 05/15/2015 Notify RN   Final  . Comment 2 05/15/2015 Document in Chart   Final  . Glucose-Capillary 05/16/2015 116* 65 - 99 mg/dL Final  . Comment 1 05/16/2015 Notify RN   Final  . Comment 2 05/16/2015 Document in Chart   Final  . Glucose-Capillary 05/16/2015 133* 65 - 99 mg/dL Final  . Comment 1 05/16/2015 Notify RN   Final  . Comment 2 05/16/2015  Document in Chart   Final  . Glucose-Capillary 05/16/2015 132* 65 - 99 mg/dL Final  . Comment 1 05/16/2015 Notify RN   Final  . Comment 2 05/16/2015 Document in Chart   Final    Dg Chest 1 View  05/17/2015  CLINICAL DATA:  55 year old male with mental status change EXAM: CHEST 1 VIEW COMPARISON:  Chest radiograph dated 07/10/2006 FINDINGS: Single view of the chest demonstrates cardiomegaly. Median  sternotomy wires and CABG vascular clips noted. There is minimal prominence of the central vasculature may represent mild congestive changes. No focal consolidation, pleural effusion, or pneumothorax. No acute osseous pathology. IMPRESSION: Cardiomegaly with possible mild congestive changes. No focal consolidation. Electronically Signed   By: Anner Crete M.D.   On: 05/17/2015 01:02   Dg Chest 2 View  06/01/2015  CLINICAL DATA:  Lethargy.  Reported chest pain. EXAM: CHEST  2 VIEW COMPARISON:  May 21, 2015 FINDINGS: There is no edema or consolidation. Heart is mildly enlarged with pulmonary vascularity within normal limits. Patient is status post coronary artery bypass grafting. No adenopathy. No bone lesions. IMPRESSION: Mild cardiac enlargement.  No edema or consolidation. Electronically Signed   By: Lowella Grip III M.D.   On: 06/01/2015 09:38   Ct Head Wo Contrast  06/01/2015  CLINICAL DATA:  Seizure earlier today.  Altered mental status. EXAM: CT HEAD WITHOUT CONTRAST TECHNIQUE: Contiguous axial images were obtained from the base of the skull through the vertex without intravenous contrast. COMPARISON:  Head CT May 16, 2015; brain MRI May 17, 2015 FINDINGS: The ventricles are normal in size and configuration. There is no intracranial mass, hemorrhage, extra-axial fluid collection, or midline shift. Prior small infarcts in the centra semiovale bilaterally immediately adjacent to the respective lateral ventricles appear stable. There is patchy small vessel disease in the centra  semiovale bilaterally, stable. There is evidence of a prior infarct in the right basal ganglia involving and immediately lateral to the genu of the right internal capsule. There is no new gray-white compartment lesion. No acute infarct is evident. The bony calvarium appears intact. The mastoid air cells are clear. No intraorbital lesions are identified. There is mucosal thickening in the posterior maxillary antra, more on the left than on the right, stable. There is rightward deviation of the nasal septum, stable. IMPRESSION: Prior small periventricular white matter and right basal ganglia infarcts. Mild periventricular small vessel disease. No acute infarct is evident by CT. No hemorrhage or mass effect. Mild maxillary sinus disease present. Electronically Signed   By: Lowella Grip III M.D.   On: 06/01/2015 10:50   Ct Head Wo Contrast  05/17/2015  CLINICAL DATA:  55 year old male with altered mental status EXAM: CT HEAD WITHOUT CONTRAST TECHNIQUE: Contiguous axial images were obtained from the base of the skull through the vertex without intravenous contrast. COMPARISON:  MRI dated 05/11/2015 and head CT dated 07/10/2006 FINDINGS: There is no acute intracranial hemorrhage. The ventricles and sulci are appropriate in size for patient's age. Mild periventricular and deep white matter chronic microvascular ischemic changes noted. There is a a small area of infarct and encephalomalacia in the right periventricular white matter and corona radiata extending into the area of right internal capsule. There is no mass effect or midline shift. There is mild mucoperiosteal thickening of the left maxillary sinus. The remainder of the visualized paranasal sinuses and mastoid air cells are clear. The calvarium is intact. IMPRESSION: No acute intracranial hemorrhage. Periventricular and deep white matter chronic microvascular ischemic changes as well as a small focus of subacute/old infarct in the right periventricular  white matter and right internal capsule. If symptoms persist and there are no contraindications, MRI may provide better evaluation if clinically indicated Electronically Signed   By: Anner Crete M.D.   On: 05/17/2015 00:42   Mr Brain Wo Contrast  05/17/2015  CLINICAL DATA:  Breakthrough seizure, history of seizures and remote head injury. History of hypertension, diabetes and  stroke. EXAM: MRI HEAD WITHOUT CONTRAST TECHNIQUE: Multiplanar, multiecho pulse sequences of the brain and surrounding structures were obtained without intravenous contrast. COMPARISON:  CT head May 16, 2015 and MRI of the brain May 11, 2015 FINDINGS: Subcentimeter focus of reduced diffusion RIGHT corona radiata to internal capsule with normalized ADC values, present on prior MRI. No new foci of acute ischemia. Old bilateral basal ganglia and thalamus lacunar infarcts, faint susceptibility associated with RIGHT basal ganglia infarct. No lobar hematoma. Patchy to confluent supratentorial white matter FLAIR T2 hyperintensities are unchanged. No midline shift, mass effect or mass lesions. No abnormal extra-axial fluid collections. Normal major intracranial vascular flow voids present at skull base. Status post LEFT ocular lens implant. Mild LEFT maxillary mucosal thickening. No paranasal sinus air-fluid levels. Mastoid air cells are well aerated. No abnormal sellar expansion. No cerebellar tonsillar ectopia. No suspicious calvarial bone marrow signal. IMPRESSION: No acute intracranial process. Evolving subacute RIGHT corona radiata subcentimeter infarct. Old bilateral basal ganglia and thalamus lacunar infarcts. Moderate chronic small vessel ischemic disease, advanced for age though stable from prior imaging. Electronically Signed   By: Elon Alas M.D.   On: 05/17/2015 04:06   Mr Brain Wo Contrast  05/11/2015  CLINICAL DATA:  55 year old male with sudden onset left side weakness since last night. Symptom onset 2100 hours.  Initial encounter. EXAM: MRI HEAD WITHOUT CONTRAST TECHNIQUE: Multiplanar, multiecho pulse sequences of the brain and surrounding structures were obtained without intravenous contrast. COMPARISON:  Head CT without contrast 07/10/2006. FINDINGS: 9 mm oval area of restricted diffusion in the posterior right corona radiata tracking to the putamen. No other convincing restricted diffusion. Subtle signal abnormality along the left ventral brainstem on series 3, image 18 does not look suspicious on thin coronal imaging (series 5, image 9). Major intracranial vascular flow voids are preserved. There are multifocal chronic deep gray matter and corona radiata lacunar infarcts superimposed on the acute finding, more so in the right hemisphere. The brainstem and cerebellum appear spared. No associated cerebral cortical encephalomalacia. Occasional chronic micro hemorrhages. No midline shift, mass effect, evidence of mass lesion, ventriculomegaly, extra-axial collection or acute intracranial hemorrhage. Cervicomedullary junction and pituitary are within normal limits. Negative visualized cervical spine. Normal bone marrow signal. Visible internal auditory structures appear normal. Mastoids are clear. Trace mucosal thickening in the paranasal sinuses. Postoperative changes to the left globe. Otherwise negative orbit and scalp soft tissues. IMPRESSION: 1. Acute on chronic small vessel infarct affecting the right corona radiata and putamen. No associated mass effect or hemorrhage. 2. Numerous underlying chronic lacunar infarcts in the bilateral deep gray matter nuclei (MCA and PCA territories). Electronically Signed   By: Genevie Ann M.D.   On: 05/11/2015 12:35   Dg Chest Port 1 View  05/21/2015  CLINICAL DATA:  Seizure today EXAM: PORTABLE CHEST 1 VIEW COMPARISON:  05/16/2015 FINDINGS: Upper normal heart size. Low volumes. Mild basilar atelectasis. No pneumothorax. IMPRESSION: Bibasilar atelectasis. Electronically Signed   By:  Marybelle Killings M.D.   On: 05/21/2015 10:39   Dg Knee Complete 4 Views Right  05/16/2015  CLINICAL DATA:  54 year old who fell yesterday while in the shower at home, injuring the right knee. Anterior and lateral pain. Initial encounter. EXAM: RIGHT KNEE - COMPLETE 4+ VIEW COMPARISON:  None. FINDINGS: No evidence of acute fracture or dislocation. Mild medial compartment joint space narrowing. Patellofemoral and lateral compartment joint spaces well preserved. Bone mineral density well-preserved. Circumscribed mixed lucent and sclerotic cortical lesion arising from the posterior cortex of  the proximal tibial metaphysis. No other intrinsic osseous abnormality. Small to moderate-sized joint effusion. Mild prepatellar soft tissue swelling. IMPRESSION: 1. No acute osseous abnormality. 2. Small to moderate-sized joint effusion. 3. 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 4. 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 Electronically Signed   By: Evangeline Dakin M.D.   On: 05/16/2015 15:26     Assessment/Plan   ICD-9-CM ICD-10-CM   1. Essential hypertension - uncontrolled 401.9 I10   2. History of stroke with residual deficit 438.9 I69.30   3. 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 Uncontrolled type 2 diabetes mellitus with complication, with long-term current use of insulin (HCC) 250.82 E11.8    V58.67 E11.65     Z79.4   5. HLD (hyperlipidemia) 272.4 E78.5   6. Seizure ([place]) 780.39 R56.9   7. Frequent falls V15.88 R29.6     Cont current meds as ordered. BP meds being titrated gradually  PT/OT/ST as ordered  Fall precautions  GOAL[facility]. Communicated with pt and nursing  Will follow  Hadia Minier S. Perlie Gold  [facility] [address] [place], [place]  [address] [phone] Cell (Monday-Friday 8 AM - 5 PM) [phone] After 5 PM and follow prompts

## 2015-06-08 ENCOUNTER — Encounter: Payer: Self-pay | Admitting: Adult Health

## 2015-06-08 ENCOUNTER — Non-Acute Institutional Stay (SKILLED_NURSING_FACILITY): Payer: Self-pay | Admitting: Adult Health

## 2015-06-08 DIAGNOSIS — F4323 Adjustment disorder with mixed anxiety and depressed mood: Secondary | ICD-10-CM

## 2015-06-08 NOTE — Progress Notes (Signed)
Patient ID: Donald Marks, male   DOB: 05/27/1960, 55 y.o.   MRN: [id number]   Facility: [facility]       Allergies  Allergen Reactions  . Contrast Media [Iodinated Diagnostic Agents]     Chief Complaint  Patient presents with  . Acute Visit    Nursing Concern    HPI:  Staff reports that he is having increased confusion with flight of ideas present. There are hallucinations present. He is able to hold a conversation but has difficulty focusing on the conversation.    Past Medical History  Diagnosis Date  . MI (myocardial infarction) (HCC) 04/30/2015 with stroke  . Diabetes mellitus without complication (HCC)   . Hypertension   . Coronary artery disease CABG  2015 x 3    [place]  [facility]  . Seizure (HCC)   . CVA (cerebral infarction) 04/30/2015 in [place], [place]  . CVA (cerebral vascular accident) (HCC) 05/2015    Past Surgical History  Procedure Laterality Date  . Coronary artery bypass graft    . Cardiac catheterization    . Cholecystectomy    . Mouth surgery      VITAL SIGNS BP 132/68 mmHg  Pulse 72  Temp(Src) 97.1 F (36.2 C) (Oral)  Resp 20  Ht  (1.803 m)  Wt 239 lb (108.41 kg)  BMI 33.35 kg/m2  SpO2 94%  Patient's Medications  New Prescriptions   No medications on file  Previous Medications   AMLODIPINE (NORVASC) 10 MG TABLET    Take 10 mg by mouth daily.   ASPIRIN 81 MG CHEWABLE TABLET    Chew 81 mg by mouth daily as needed for mild pain.   ATORVASTATIN (LIPITOR) 80 MG TABLET    Take 80 mg by mouth daily.   CARVEDILOL (COREG) 25 MG TABLET    Take 1 tablet (25 mg total) by mouth 2 (two) times daily with a meal.   CHOLECALCIFEROL (VITAMIN D3) 2000 UNITS CAPSULE    Take 2,000 Units by mouth daily.   CLOPIDOGREL (PLAVIX) 75 MG TABLET    Take 1 tablet (75 mg total) by mouth daily.   DIAZEPAM (DIASTAT) 2.5 MG GEL    Place 2.5 mg rectally once. for Seizure lasting longer than 5 minutes. Please do not use more than 5 times in a month.   HYDRALAZINE (APRESOLINE) 25 MG TABLET    Take 1 tablet (25 mg total) by mouth 3 (three) times daily.   HYDROCHLOROTHIAZIDE (HYDRODIURIL) 25 MG TABLET    Take 1 tablet (25 mg total) by mouth daily.   INSULIN GLARGINE (LANTUS) 100 UNIT/ML INJECTION    Inject 0.1 mLs (10 Units total) into the skin at bedtime. Patient uses sliding scale   INSULIN LISPRO (HUMALOG KWIKPEN) 100 UNIT/ML KIWKPEN    Inject into the skin. Inject as per sliding scale: if 0-149 =0 units; 150-399 = 5 units CBG 400 or greater call MD, subcutaneously before meals and at bedtime related to DM   LAMOTRIGINE (LAMICTAL) 150 MG TABLET    Take 1 tablet (150 mg total) by mouth 2 (two) times daily.   LISINOPRIL (PRINIVIL,ZESTRIL) 10 MG TABLET    Take 1 tablet (10 mg total) by mouth daily.   MAGNESIUM OXIDE (MAG-OX) 400 MG TABLET    Take 400 mg by mouth daily.   NITROGLYCERIN (NITROSTAT) 0.4 MG SL TABLET    Place 1 tablet (0.4 mg total) under the tongue every 5 (five) minutes as needed for chest pain.   ONDANSETRON (ZOFRAN)  4 MG TABLET    Take 4 mg by mouth every 8 (eight) hours as needed for nausea or vomiting.  Modified Medications   No medications on file  Discontinued Medications   No medications on file     SIGNIFICANT DIAGNOSTIC EXAMS  05-14-15: 2-d echo: - Left ventricle: There was mild concentric hypertrophy. Systolic function was normal. The estimated ejection fraction was in the range of 60% to 65%. Although no diagnostic regional wall motion abnormality was identified, this possibility cannot be completely excluded on the basis of this study. Features are consistent with a pseudonormal left ventricular filling pattern, with concomitant abnormal relaxation and increased filling pressure (grade   diastolic dysfunction). - Left atrium: The atrium was severely dilated.  05-16-15: right knee x-ray: 1. No acute osseous abnormality. 2. Small to moderate-sized joint effusion. 3. 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 4. 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 Periventricular and deep white matter chronic microvascular ischemic changes as well as a small focus of subacute/old infarct in the right periventricular white matter and right internal capsule.   05-17-15: EEG: This predominantly drowsy and asleep EEG is normal.    05-17-15: chest x-ray: Cardiomegaly with possible mild congestive changes. No focal consolidation  05-17-15: MRI of brain: No acute intracranial process. Evolving subacute RIGHT corona radiata subcentimeter infarct.  Old bilateral basal ganglia and thalamus lacunar infarcts. Moderate chronic small vessel ischemic disease, advanced for age though stable from prior imaging.  05-21-15; chest x-ray: Bibasilar atelectasis  05-22-15: right knee x-ray: joint effusion; no acute fracture   06-01-15: chest x-ray: Mild cardiac enlargement.  No edema or consolidation  06-01-15: ct of head: Prior small periventricular white matter and right basal ganglia infarcts. Mild periventricular small vessel disease. No acute infarct is evident by CT. No hemorrhage or mass effect. Mild maxillary sinus disease present.      LABS REVIEWED:   05-11-15: wbc 7.5; hb 12.;1 hct 36.6; mcv 88.0; plt 325; glucose 304; bun 20; creat 2.11; k+ 4.2; na++140; ast 13; total bili 0.2; albumin 2.9 05-12-15: wbc 8.2; hgb 10.5; hct 33.3; mcv 87.6; plt 316; glucose 165; bun 14; creat 1.45; k+ 3.8; na++142; hgb a1c 10.5; chol 204; ldl 141; trig 139; hdl 35 1-61-09: wbc 9.4; hgb 11.7; hct 36.3; mcv 89.9; plt 341; glucose 128; bun 15; creat 1.89; k+ 3.7; na++142; lamictal 10.0 05-21-15: wbc 9.6; hgb 11.2; hct 35.7;mcv 88.6; plt 386; glucose 104; bun 14; creat 1.71; k+ 4.0; na++142  06-01-15: wbc 9.1; hgb 12.6; hct 38.1; mcv 88.2; plt 499 ;glcuose 137; bun 6; creat 1.48; k+ 3.8; na++145; liver normal albumin 3.5     Review  of Systems  Constitutional: Negative for malaise/fatigue.  Respiratory: Negative for cough and shortness of breath.   Cardiovascular: Negative for chest pain, palpitations and leg swelling.  Gastrointestinal: Negative for heartburn, abdominal pain and constipation. has nausea and vomiting  Musculoskeletal: negative for joint pain . Negative for myalgias and back pain.   Skin: Negative.   Neurological: Negative for dizziness.  Psychiatric/Behavioral: The patient is not nervous/anxious.      Physical Exam  Constitutional: He is oriented to person, place, and time. No distress.  Eyes: Conjunctivae are normal.  Neck: Neck supple. No JVD present. No thyromegaly present.  Cardiovascular: Normal rate, regular rhythm and intact distal pulses.   Respiratory: Effort normal and breath sounds normal. No respiratory distress. He has  no wheezes.  GI: Soft. Bowel sounds are normal. He exhibits no distension. There is no tenderness.  Musculoskeletal: He exhibits no edema.  Able to move all extremities    Lymphadenopathy:    He has no cervical adenopathy.  Neurological: He is alert and oriented to person, place, and time.  Skin: Skin is warm and dry. He is not diaphoretic.  Psychiatric: He has a normal mood and affect.      ASSESSMENT/ PLAN:  1. Depression with anxiety: will being zoloft 50 mg daily and will monitor      Synthia Innocenteborah Fariha Goto NP [facility]  Contact [phone] Monday through Friday 8am- 5pm  After hours call [phone]

## 2015-06-18 DIAGNOSIS — F4323 Adjustment disorder with mixed anxiety and depressed mood: Secondary | ICD-10-CM | POA: Insufficient documentation

## 2017-10-30 DEATH — deceased

## 2017-11-08 IMAGING — CT CT HEAD W/O CM
2 series · 15 of 30 positions shown, 17 images · non-contrast
Comparison: MRI dated 05/11/2015 and head CT dated 07/10/2006

CLINICAL DATA: 54-year-old male with altered mental status

EXAM:
CT HEAD WITHOUT CONTRAST
TECHNIQUE: Contiguous axial images were obtained from the base of the skull
through the vertex without intravenous contrast.

[Series 2: head without · axial · non-contrast · 0.43mm/px · z∈[-109,+11]mm · 7 of 33 slices shown, 9 images]
[im 5/33  brain]
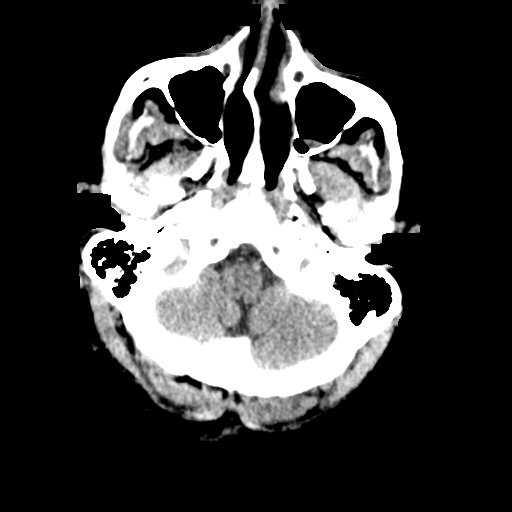
[im 5/33  bone]
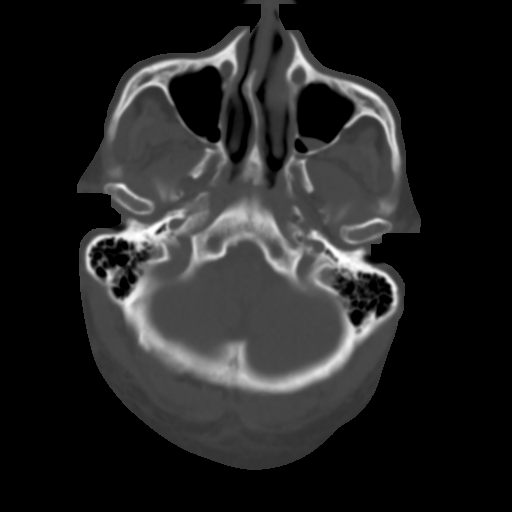
[im 9/33  brain]
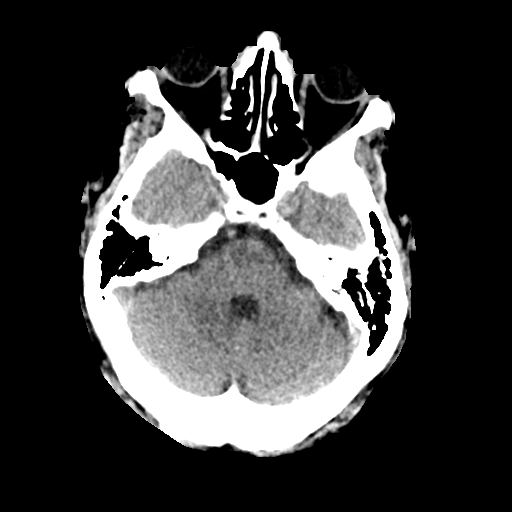
[im 13/33  brain]
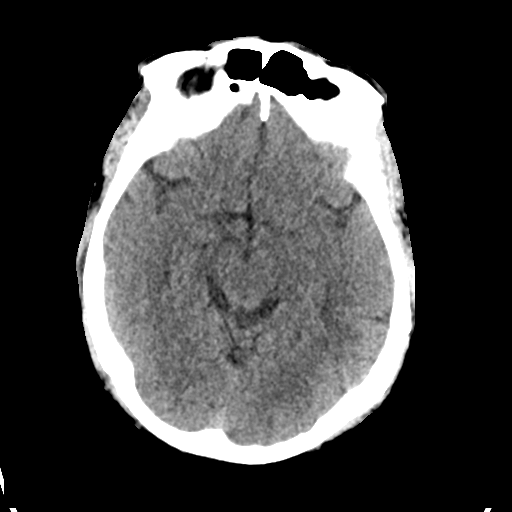
[im 17/33  brain]
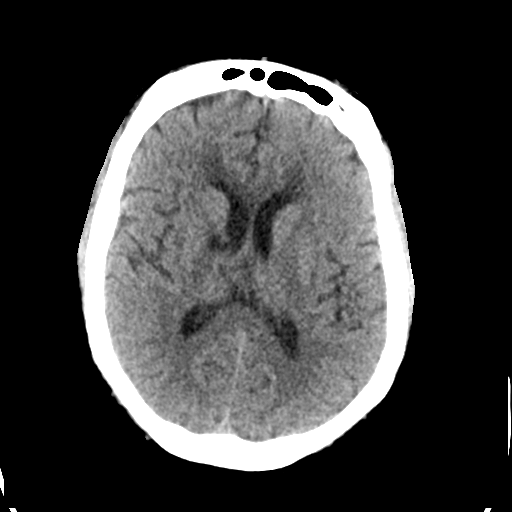
[im 21/33  brain]
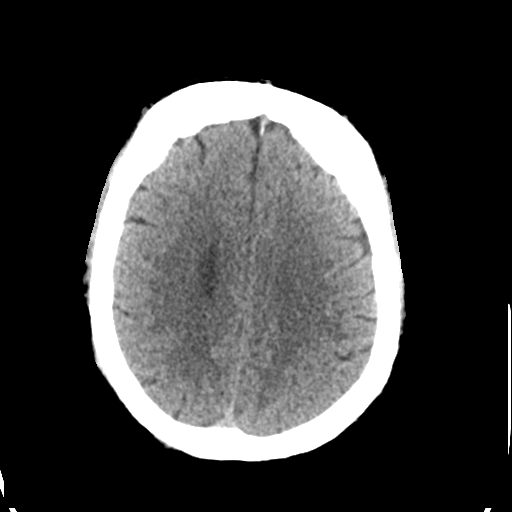
[im 21/33  bone]
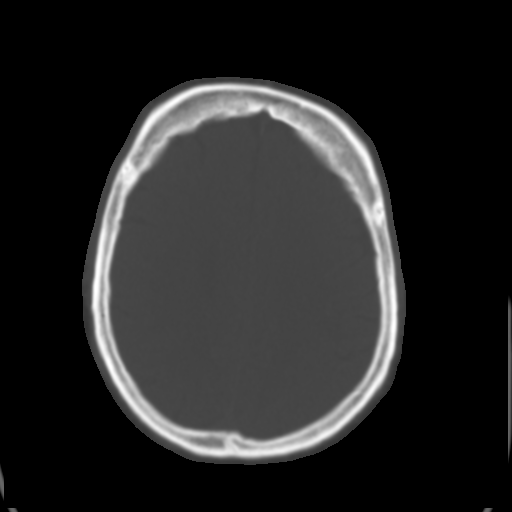
[im 25/33  brain]
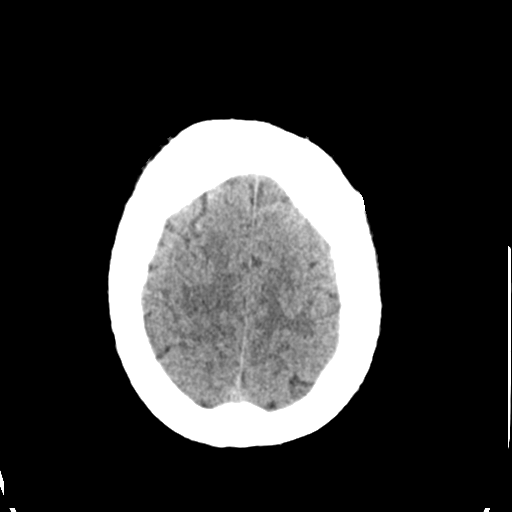
[im 29/33  brain]
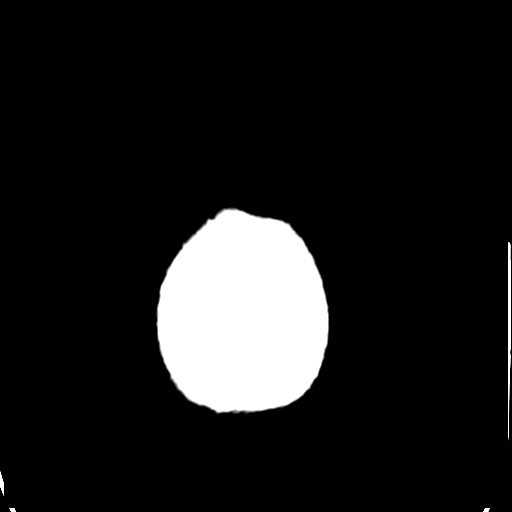

[Series 3: head bone · axial · 0.43mm/px · z∈[-113,+15]mm · 8 of 82 slices shown]
[im 9/82  bone]
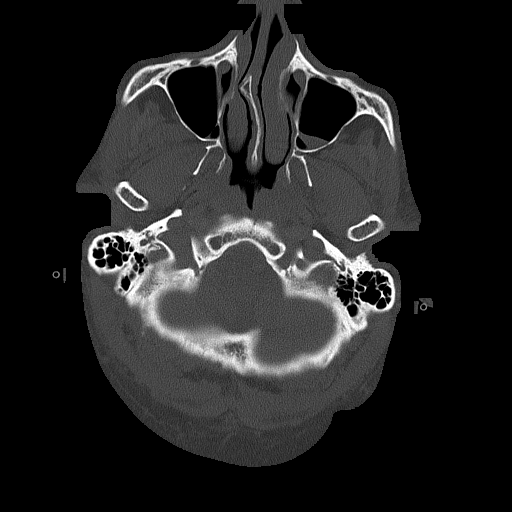
[im 17/82  bone]
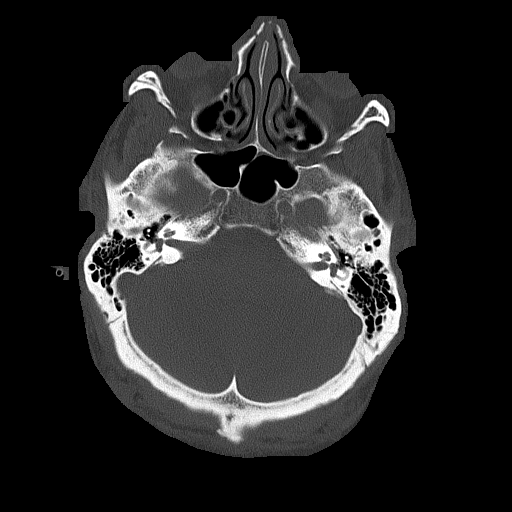
[im 25/82  bone]
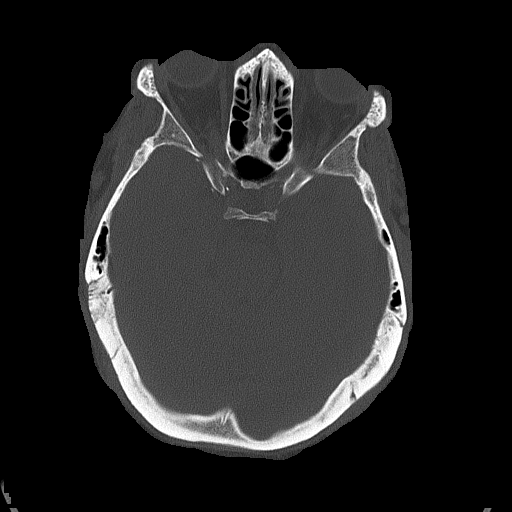
[im 37/82  bone]
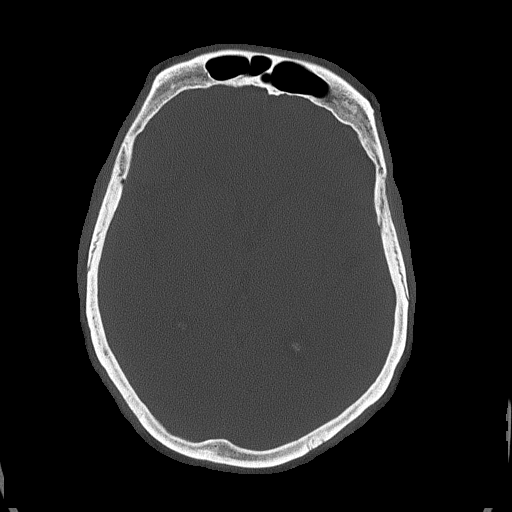
[im 45/82  bone]
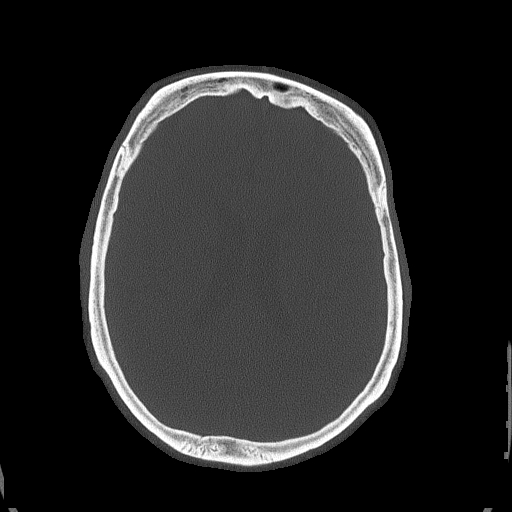
[im 57/82  bone]
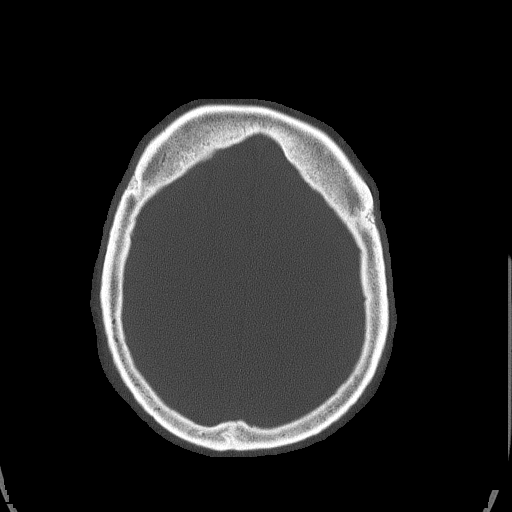
[im 65/82  bone]
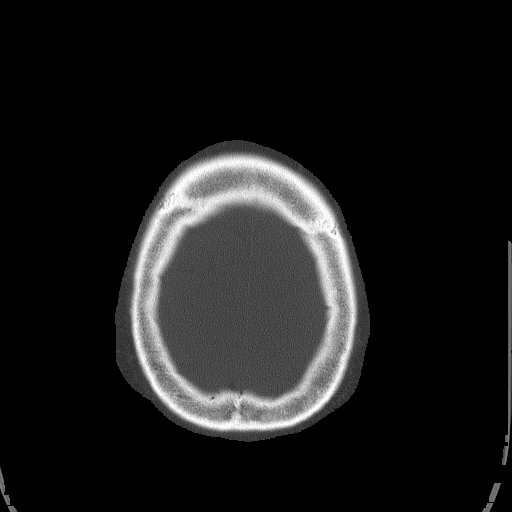
[im 73/82  bone]
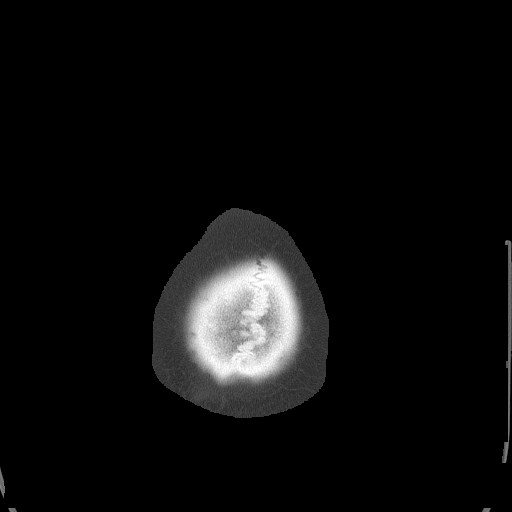

[15 of 30 positions shown; findings below may reference images not displayed]

FINDINGS: There is no acute intracranial hemorrhage. The ventricles and sulci
are appropriate in size for patient's age. Mild periventricular and
deep white matter chronic microvascular ischemic changes noted.
There is a a small area of infarct and encephalomalacia in the right
periventricular white matter and corona radiata extending into the
area of right internal capsule. There is no mass effect or midline
shift.

There is mild mucoperiosteal thickening of the left maxillary sinus.
The remainder of the visualized paranasal sinuses and mastoid air
cells are clear. The calvarium is intact.
IMPRESSION: No acute intracranial hemorrhage.

Periventricular and deep white matter chronic microvascular ischemic
changes as well as a small focus of subacute/old infarct in the
right periventricular white matter and right internal capsule.

If symptoms persist and there are no contraindications, MRI may
provide better evaluation if clinically indicated

## 2017-11-13 IMAGING — CR DG CHEST 1V PORT
1 series · 1 of 1 positions shown · non-contrast
Comparison: 05/16/2015

CLINICAL DATA: Seizure today

EXAM:
PORTABLE CHEST 1 VIEW

[AP]
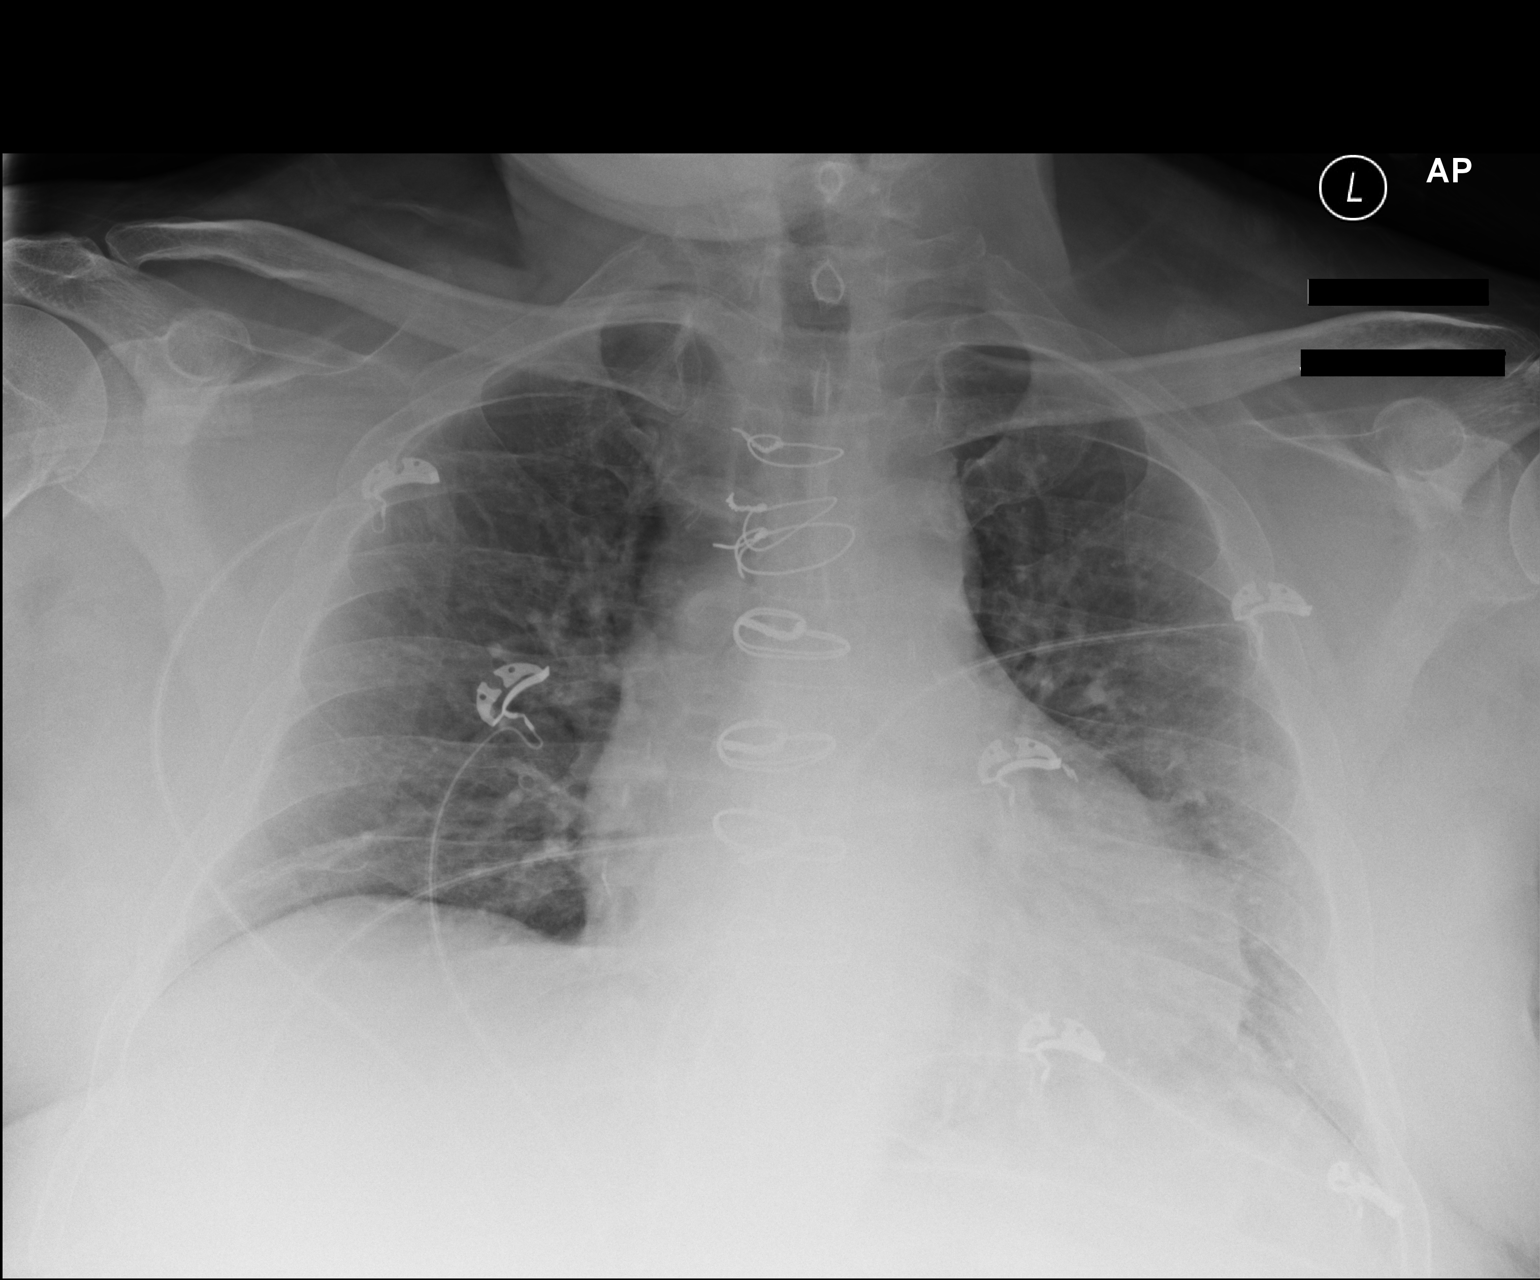

[1 of 1 positions shown; findings below may reference images not displayed]

FINDINGS: Upper normal heart size. Low volumes. Mild basilar atelectasis. No
pneumothorax.
IMPRESSION: Bibasilar atelectasis.
